# Patient Record
Sex: Female | Born: 1945
Health system: Southern US, Community
[De-identification: ages and names within clinical notes are randomized; demographics above are authoritative.]

## PROBLEM LIST (undated history)

## (undated) DIAGNOSIS — E785 Hyperlipidemia, unspecified: Secondary | ICD-10-CM

## (undated) DIAGNOSIS — I10 Essential (primary) hypertension: Secondary | ICD-10-CM

## (undated) DIAGNOSIS — R32 Unspecified urinary incontinence: Secondary | ICD-10-CM

## (undated) DIAGNOSIS — R112 Nausea with vomiting, unspecified: Secondary | ICD-10-CM

## (undated) DIAGNOSIS — Z9889 Other specified postprocedural states: Secondary | ICD-10-CM

## (undated) DIAGNOSIS — K759 Inflammatory liver disease, unspecified: Secondary | ICD-10-CM

## (undated) DIAGNOSIS — R51 Headache: Secondary | ICD-10-CM

## (undated) DIAGNOSIS — T4145XA Adverse effect of unspecified anesthetic, initial encounter: Secondary | ICD-10-CM

## (undated) DIAGNOSIS — R232 Flushing: Secondary | ICD-10-CM

## (undated) DIAGNOSIS — T8859XA Other complications of anesthesia, initial encounter: Secondary | ICD-10-CM

## (undated) DIAGNOSIS — K219 Gastro-esophageal reflux disease without esophagitis: Secondary | ICD-10-CM

## (undated) HISTORY — PX: TUBAL LIGATION: SHX77

---

## 1989-06-19 HISTORY — PX: ABDOMINAL HYSTERECTOMY: SHX81

## 1999-11-29 ENCOUNTER — Encounter: Admission: RE | Admit: 1999-11-29 | Discharge: 1999-11-29 | Payer: Self-pay | Admitting: Family Medicine

## 1999-11-29 ENCOUNTER — Encounter: Payer: Self-pay | Admitting: Family Medicine

## 2003-12-22 ENCOUNTER — Other Ambulatory Visit: Admission: RE | Admit: 2003-12-22 | Discharge: 2003-12-22 | Payer: Self-pay | Admitting: Family Medicine

## 2009-05-20 ENCOUNTER — Encounter: Admission: RE | Admit: 2009-05-20 | Discharge: 2009-05-20 | Payer: Self-pay | Admitting: Diagnostic Neuroimaging

## 2009-05-27 ENCOUNTER — Encounter (INDEPENDENT_AMBULATORY_CARE_PROVIDER_SITE_OTHER): Payer: Self-pay | Admitting: Diagnostic Neuroimaging

## 2009-05-27 ENCOUNTER — Ambulatory Visit: Payer: Self-pay

## 2009-05-27 ENCOUNTER — Ambulatory Visit (HOSPITAL_COMMUNITY): Admission: RE | Admit: 2009-05-27 | Discharge: 2009-05-27 | Payer: Self-pay | Admitting: Diagnostic Neuroimaging

## 2009-05-27 ENCOUNTER — Ambulatory Visit: Payer: Self-pay | Admitting: Cardiology

## 2009-06-01 ENCOUNTER — Encounter: Payer: Self-pay | Admitting: Cardiology

## 2010-06-19 HISTORY — PX: BUNIONECTOMY: SHX129

## 2013-01-08 DIAGNOSIS — Z5181 Encounter for therapeutic drug level monitoring: Secondary | ICD-10-CM | POA: Diagnosis not present

## 2013-01-08 DIAGNOSIS — Z79899 Other long term (current) drug therapy: Secondary | ICD-10-CM | POA: Diagnosis not present

## 2013-01-08 DIAGNOSIS — Z8371 Family history of colonic polyps: Secondary | ICD-10-CM | POA: Diagnosis not present

## 2013-01-08 DIAGNOSIS — K219 Gastro-esophageal reflux disease without esophagitis: Secondary | ICD-10-CM | POA: Diagnosis not present

## 2013-02-04 DIAGNOSIS — H2510 Age-related nuclear cataract, unspecified eye: Secondary | ICD-10-CM | POA: Diagnosis not present

## 2013-03-04 DIAGNOSIS — G43909 Migraine, unspecified, not intractable, without status migrainosus: Secondary | ICD-10-CM | POA: Diagnosis not present

## 2013-03-04 DIAGNOSIS — I10 Essential (primary) hypertension: Secondary | ICD-10-CM | POA: Diagnosis not present

## 2013-03-04 DIAGNOSIS — Z0000 Encounter for general adult medical examination without abnormal findings: Secondary | ICD-10-CM | POA: Diagnosis not present

## 2013-03-04 DIAGNOSIS — N819 Female genital prolapse, unspecified: Secondary | ICD-10-CM | POA: Diagnosis not present

## 2013-03-04 DIAGNOSIS — E7800 Pure hypercholesterolemia, unspecified: Secondary | ICD-10-CM | POA: Diagnosis not present

## 2013-03-04 DIAGNOSIS — N951 Menopausal and female climacteric states: Secondary | ICD-10-CM | POA: Diagnosis not present

## 2013-03-18 DIAGNOSIS — Z23 Encounter for immunization: Secondary | ICD-10-CM | POA: Diagnosis not present

## 2013-03-31 DIAGNOSIS — N819 Female genital prolapse, unspecified: Secondary | ICD-10-CM | POA: Diagnosis not present

## 2013-06-16 DIAGNOSIS — R350 Frequency of micturition: Secondary | ICD-10-CM | POA: Diagnosis not present

## 2013-06-16 DIAGNOSIS — N3946 Mixed incontinence: Secondary | ICD-10-CM | POA: Diagnosis not present

## 2013-06-16 DIAGNOSIS — N816 Rectocele: Secondary | ICD-10-CM | POA: Diagnosis not present

## 2013-06-16 DIAGNOSIS — N8111 Cystocele, midline: Secondary | ICD-10-CM | POA: Diagnosis not present

## 2013-07-04 DIAGNOSIS — E538 Deficiency of other specified B group vitamins: Secondary | ICD-10-CM | POA: Diagnosis not present

## 2013-07-04 DIAGNOSIS — Z23 Encounter for immunization: Secondary | ICD-10-CM | POA: Diagnosis not present

## 2013-07-04 DIAGNOSIS — F329 Major depressive disorder, single episode, unspecified: Secondary | ICD-10-CM | POA: Diagnosis not present

## 2013-07-04 DIAGNOSIS — E7800 Pure hypercholesterolemia, unspecified: Secondary | ICD-10-CM | POA: Diagnosis not present

## 2013-07-04 DIAGNOSIS — F3289 Other specified depressive episodes: Secondary | ICD-10-CM | POA: Diagnosis not present

## 2013-07-04 DIAGNOSIS — I10 Essential (primary) hypertension: Secondary | ICD-10-CM | POA: Diagnosis not present

## 2013-07-04 DIAGNOSIS — F411 Generalized anxiety disorder: Secondary | ICD-10-CM | POA: Diagnosis not present

## 2013-07-04 DIAGNOSIS — Z0000 Encounter for general adult medical examination without abnormal findings: Secondary | ICD-10-CM | POA: Diagnosis not present

## 2013-07-04 DIAGNOSIS — N951 Menopausal and female climacteric states: Secondary | ICD-10-CM | POA: Diagnosis not present

## 2013-07-15 DIAGNOSIS — R351 Nocturia: Secondary | ICD-10-CM | POA: Diagnosis not present

## 2013-07-15 DIAGNOSIS — R350 Frequency of micturition: Secondary | ICD-10-CM | POA: Diagnosis not present

## 2013-07-15 DIAGNOSIS — N3946 Mixed incontinence: Secondary | ICD-10-CM | POA: Diagnosis not present

## 2013-07-21 DIAGNOSIS — N816 Rectocele: Secondary | ICD-10-CM | POA: Diagnosis not present

## 2013-07-21 DIAGNOSIS — N3946 Mixed incontinence: Secondary | ICD-10-CM | POA: Diagnosis not present

## 2013-07-30 ENCOUNTER — Other Ambulatory Visit: Payer: Self-pay | Admitting: Urology

## 2013-09-01 ENCOUNTER — Other Ambulatory Visit: Payer: Self-pay | Admitting: Urology

## 2013-09-03 ENCOUNTER — Encounter (HOSPITAL_COMMUNITY): Payer: Self-pay | Admitting: Pharmacy Technician

## 2013-09-08 ENCOUNTER — Encounter (HOSPITAL_COMMUNITY): Payer: Self-pay

## 2013-09-08 ENCOUNTER — Encounter (HOSPITAL_COMMUNITY)
Admission: RE | Admit: 2013-09-08 | Discharge: 2013-09-08 | Disposition: A | Discharge status: Home / Self Care | Payer: Medicare Other | Source: Ambulatory Visit | Attending: Urology | Admitting: Urology

## 2013-09-08 ENCOUNTER — Ambulatory Visit (HOSPITAL_COMMUNITY)
Admission: RE | Admit: 2013-09-08 | Discharge: 2013-09-08 | Disposition: A | Discharge status: Home / Self Care | Payer: Medicare Other | Source: Ambulatory Visit | Attending: Urology | Admitting: Urology

## 2013-09-08 DIAGNOSIS — R0602 Shortness of breath: Secondary | ICD-10-CM | POA: Diagnosis not present

## 2013-09-08 DIAGNOSIS — Z01812 Encounter for preprocedural laboratory examination: Secondary | ICD-10-CM | POA: Insufficient documentation

## 2013-09-08 HISTORY — DX: Nausea with vomiting, unspecified: R11.2

## 2013-09-08 HISTORY — DX: Other specified postprocedural states: Z98.890

## 2013-09-08 HISTORY — DX: Essential (primary) hypertension: I10

## 2013-09-08 HISTORY — DX: Headache: R51

## 2013-09-08 HISTORY — DX: Flushing: R23.2

## 2013-09-08 HISTORY — DX: Unspecified urinary incontinence: R32

## 2013-09-08 HISTORY — DX: Other complications of anesthesia, initial encounter: T88.59XA

## 2013-09-08 HISTORY — DX: Gastro-esophageal reflux disease without esophagitis: K21.9

## 2013-09-08 HISTORY — DX: Inflammatory liver disease, unspecified: K75.9

## 2013-09-08 HISTORY — DX: Adverse effect of unspecified anesthetic, initial encounter: T41.45XA

## 2013-09-08 HISTORY — DX: Hyperlipidemia, unspecified: E78.5

## 2013-09-08 LAB — BASIC METABOLIC PANEL
BUN: 14 mg/dL (ref 6–23)
CHLORIDE: 98 meq/L (ref 96–112)
CO2: 23 mEq/L (ref 19–32)
Calcium: 9.7 mg/dL (ref 8.4–10.5)
Creatinine, Ser: 0.83 mg/dL (ref 0.50–1.10)
GFR calc Af Amer: 84 mL/min — ABNORMAL LOW (ref >90)
GFR calc non Af Amer: 72 mL/min — ABNORMAL LOW (ref >90)
Glucose, Bld: 137 mg/dL — ABNORMAL HIGH (ref 70–99)
POTASSIUM: 3.9 meq/L (ref 3.7–5.3)
Sodium: 136 mEq/L — ABNORMAL LOW (ref 137–147)

## 2013-09-08 LAB — APTT: aPTT: 26 seconds (ref 24–37)

## 2013-09-08 LAB — CBC
HCT: 43.1 % (ref 36.0–46.0)
HEMOGLOBIN: 14.6 g/dL (ref 12.0–15.0)
MCH: 29.8 pg (ref 26.0–34.0)
MCHC: 33.9 g/dL (ref 30.0–36.0)
MCV: 88 fL (ref 78.0–100.0)
Platelets: 236 10*3/uL (ref 150–400)
RBC: 4.9 MIL/uL (ref 3.87–5.11)
RDW: 12.9 % (ref 11.5–15.5)
WBC: 6.8 10*3/uL (ref 4.0–10.5)

## 2013-09-08 LAB — ABO/RH: ABO/RH(D): O NEG

## 2013-09-08 LAB — PROTIME-INR
INR: 0.94 (ref 0.00–1.49)
PROTHROMBIN TIME: 12.4 s (ref 11.6–15.2)

## 2013-09-08 NOTE — Progress Notes (Signed)
Abnormal CXR faxed to Dr. Matilde Sprang

## 2013-09-08 NOTE — Patient Instructions (Addendum)
Sophia Bailey  09/08/2013                           YOUR PROCEDURE IS SCHEDULED ON: 09/16/13               PLEASE REPORT TO SHORT STAY CENTER AT :  5:30 AM               CALL THIS NUMBER IF ANY PROBLEMS THE DAY OF SURGERY :               [phone]                                REMEMBER:   Do not eat food or drink liquids AFTER MIDNIGHT                  Take these medicines the morning of surgery with A SIP OF WATER:  PRILOSEC   Do not wear jewelry, make-up   Do not wear lotions, powders, or perfumes.   Do not shave legs or underarms 12 hrs. before surgery (men may shave face)  Do not bring valuables to the hospital.  Contacts, dentures or bridgework may not be worn into surgery.  Leave suitcase in the car. After surgery it may be brought to your room.  For patients admitted to the hospital more than one night, checkout time is            11:00 AM                                                       The day of discharge.   Patients discharged the day of surgery will not be allowed to drive home.            If going home same day of surgery, must have someone stay with you              FIRST 24 hrs at home and arrange for some one to drive you              home from hospital.    Special Instructions             Please read over the following fact sheets that you were given:               1. [place]                2. USE [employer]LE[employer] [place]                                                X_____________________________________________________________________        Failure to follow these instructions may result in cancellation of your surgery

## 2013-09-09 ENCOUNTER — Other Ambulatory Visit (HOSPITAL_COMMUNITY): Payer: Self-pay | Admitting: Urology

## 2013-09-09 DIAGNOSIS — R222 Localized swelling, mass and lump, trunk: Secondary | ICD-10-CM

## 2013-09-10 ENCOUNTER — Ambulatory Visit (HOSPITAL_COMMUNITY)
Admission: RE | Admit: 2013-09-10 | Discharge: 2013-09-10 | Disposition: A | Discharge status: Home / Self Care | Payer: Medicare Other | Source: Ambulatory Visit | Attending: Urology | Admitting: Urology

## 2013-09-10 DIAGNOSIS — R222 Localized swelling, mass and lump, trunk: Secondary | ICD-10-CM

## 2013-09-10 DIAGNOSIS — R918 Other nonspecific abnormal finding of lung field: Secondary | ICD-10-CM | POA: Diagnosis not present

## 2013-09-10 DIAGNOSIS — I77810 Thoracic aortic ectasia: Secondary | ICD-10-CM | POA: Diagnosis not present

## 2013-09-10 MED ORDER — IOHEXOL 300 MG/ML  SOLN
80.0000 mL | Freq: Once | INTRAMUSCULAR | Status: AC | PRN
  Administered 2013-09-10: 80 mL via INTRAVENOUS

## 2013-09-15 MED ORDER — GENTAMICIN SULFATE 40 MG/ML IJ SOLN
340.0000 mg | INTRAVENOUS | Status: AC
  Administered 2013-09-16: 340 mg via INTRAVENOUS
  Filled 2013-09-15: qty 8.5

## 2013-09-15 NOTE — H&P (Signed)
History of Present Illness   Ms. Sophia Bailey has mild stress and urge incontinence. She voided every 60-90 minutes. She can feel vaginal bulging. She has had a hysterectomy. On pelvic examination she had a negative cough test with grade 2 hypermobility. She had a small grade 2 cystocele with very little central defect. She had a modest distal grade 2 rectocele. She has moderately severe frequency and mild to moderate nocturia. Yesterday I thought her prolapse symptoms were probably from a rectocele and I wanted to reevaluate the ____cystocele fluoroscopically. If she ever consented to surgery, she likely would benefit from a rectocele repair and possible sling.   Review of Systems: No change in bowel or neurologic systems.   On urodynamics, she emptied efficiently. Her maximum capacity was 243 mL. Her bladder was unstable reaching pressures of 13 cmH2O but she did not leak. At 100 mL she had very mild leakage at 69 cmH2O with a cough. It was 91 cmH2O with a Valsalva. At 169 mL it was 76 cmH2O with mild leakage. During voluntary voiding, she voided 243 mL with a maximum flow of 28 mL/sec. Maximum voiding pressure was 22 cmH2O. Bladder neck descended approximately 2 cm. She had a small cystocele as noted. The details of the urodynamics are signed and dictated on the urodynamic sheet.    Past Medical History Problems  1. History of hypercholesterolemia (V12.29) 2. History of hypertension (V12.59) 3. History of Reflux (530.81)  Surgical History Problems  1. History of Hysterectomy 2. History of Simple Bunion Exostectomy (Silver Procedure)  Current Meds 1. Lisinopril-Hydrochlorothiazide 10-12.5 MG Oral Tablet;  Therapy: (Recorded:29Dec2014) to Recorded 2. Maxalt TABS;  Therapy: (Recorded:29Dec2014) to Recorded 3. [place];  Therapy: (Recorded:29Dec2014) to Recorded  Allergies Medication  1. Demerol TABS 2. Codeine Derivatives  Family History Problems  1. Family history of malignant  neoplasm of breast (V16.3) : Mother 2. Family history of myocardial infarction (V17.3) : Father  Social History Problems  1. Alcohol use   1 or 2 per month 2. Daily caffeine consumption, 2-3 servings a day 3. Death in the family, father   age 91 due to heart attack 4. Married 5. Non-smoker (V49.89) 6. Retired   Pharmacist, hospital 7. Three children   one son and two daughters  Assessment Assessed  1. Rectocele, female (618.04) 2. Urge and stress incontinence (788.33)  Discussion/Summary   Ms. Sophia Bailey has mild stress and urge incontinence. I think both issues are reasonably significant. She has some descensus at rest with a small grade 2 cystocele but not a lot of further descensus and very little central defect. In my opinion, she is likely feeling her modest distal grade 2 rectocele. Her frequency is due to an overactive bladder.   I drew her a picture. I discussed a rectocele repair and possible cystocele repair versus watchful waiting.   I drew her a picture and we talked about prolapse surgery in detail. Pros, cons, general surgical and anesthetic risks, and other options including behavioral therapy, pessaries, and watchful waiting were discussed. She understands that prolapse repairs are successful in 80-85% of cases for prolapse symptoms and can recur anteriorly, posteriorly, and/or apically. She understands that in most cases I use a graft and general risks were discussed. Surgical risks were described but not limited to the discussion of injury to neighboring structures including the bowel (with possible life-threatening sepsis and colostomy), bladder, urethra, vagina (all resulting in further surgery), and ureter (resulting in re-implantation). We talked about injury to nerves/soft tissue leading to  debilitating and intractable pelvic, abdominal, and lower extremity pain syndromes and neuropathies. The risks of buttock pain, intractable dyspareunia, and vaginal narrowing and shortening  with sequelae were discussed. Bleeding risks, transfusion rates, and infection were discussed. The risk of persistent, de novo, or worsening bladder and/or bowel incontinence/dysfunction was discussed. The need for CIC was described as well the usual post-operative course. The patient understands that she might not reach her treatment goal and that she might be worse following surgery.  I talked to her about medical, behavioral, and physical therapy versus sling.  We talked about a sling in detail. Pros, cons, general surgical and anesthetic risks, and other options including behavioral therapy and watchful waiting were discussed. She understands that slings are generally successful in 90% of cases for stress incontinence, 50% for urge incontinence, and that in a small percentage of cases the incontinence can worsen. The risk of persistent, de novo, or worsening incontinence/dysfunction was discussed. Risks were described but not limited to the discussion of injury to neighboring structures including the bowel (with possible life-threatening sepsis and colostomy), bladder, urethra, vagina (all resulting in further surgery), and ureter (resulting in re-implantation). We also talked about the risk of retention requiring urethrolysis, extrusion requiring revision, and erosion resulting in further surgery. Bleeding risks and transfusion rates and the risk of infection were discussed. The risk of pelvic and abdominal pain syndromes, dyspareunia, and neuropathies were discussed. The need for CIC was described as well as the usual postoperative course. The patient understands that she might not reach her treatment goal and that she might be worse following surgery. Mesh TV issues were discussed.   Ms. Sophia Bailey is sexually active. She sometimes has dyspareunia. She uses over-the-counter products for vaginal dryness.   She says as long as she listens to her bladder, she is happy with her bladder function and she said  she would not have a sling. At this stage she is not going to start medical or physical therapy.  She really thinks she is probably going to go ahead and get a rectocele repair and intraoperatively she would need a cystocele repair or perform one. She was happy with our discussion. Mesh issues on TV discussed. See p.r.n. She would ____  After a thorough review of the management options for the patient's condition the patient  elected to proceed with surgical therapy as noted above. We have discussed the potential benefits and risks of the procedure, side effects of the proposed treatment, the likelihood of the patient achieving the goals of the procedure, and any potential problems that might occur during the procedure or recuperation. Informed consent has been obtained.

## 2013-09-16 ENCOUNTER — Ambulatory Visit (HOSPITAL_COMMUNITY): Payer: Medicare Other | Admitting: Anesthesiology

## 2013-09-16 ENCOUNTER — Observation Stay (HOSPITAL_COMMUNITY)
Admission: RE | Admit: 2013-09-16 | Discharge: 2013-09-17 | Disposition: A | Discharge status: Home / Self Care | Payer: Medicare Other | Source: Ambulatory Visit | Attending: Urology | Admitting: Urology

## 2013-09-16 ENCOUNTER — Encounter (HOSPITAL_COMMUNITY): Payer: Self-pay | Admitting: *Deleted

## 2013-09-16 ENCOUNTER — Encounter (HOSPITAL_COMMUNITY)
Admission: RE | Disposition: A | Discharge status: Home / Self Care | Payer: Self-pay | Source: Ambulatory Visit | Attending: Urology

## 2013-09-16 ENCOUNTER — Encounter (HOSPITAL_COMMUNITY): Payer: Medicare Other | Admitting: Anesthesiology

## 2013-09-16 DIAGNOSIS — IMO0002 Reserved for concepts with insufficient information to code with codable children: Secondary | ICD-10-CM | POA: Diagnosis not present

## 2013-09-16 DIAGNOSIS — N3946 Mixed incontinence: Secondary | ICD-10-CM | POA: Diagnosis not present

## 2013-09-16 DIAGNOSIS — I10 Essential (primary) hypertension: Secondary | ICD-10-CM | POA: Diagnosis not present

## 2013-09-16 DIAGNOSIS — Z90710 Acquired absence of both cervix and uterus: Secondary | ICD-10-CM | POA: Diagnosis not present

## 2013-09-16 DIAGNOSIS — K219 Gastro-esophageal reflux disease without esophagitis: Secondary | ICD-10-CM | POA: Insufficient documentation

## 2013-09-16 DIAGNOSIS — N8111 Cystocele, midline: Secondary | ICD-10-CM | POA: Diagnosis not present

## 2013-09-16 DIAGNOSIS — N816 Rectocele: Principal | ICD-10-CM | POA: Insufficient documentation

## 2013-09-16 DIAGNOSIS — E7800 Pure hypercholesterolemia, unspecified: Secondary | ICD-10-CM | POA: Insufficient documentation

## 2013-09-16 DIAGNOSIS — Z79899 Other long term (current) drug therapy: Secondary | ICD-10-CM | POA: Insufficient documentation

## 2013-09-16 HISTORY — PX: RECTOCELE REPAIR: SHX761

## 2013-09-16 LAB — TYPE AND SCREEN
ABO/RH(D): O NEG
Antibody Screen: NEGATIVE

## 2013-09-16 LAB — HEMOGLOBIN AND HEMATOCRIT, BLOOD
HCT: 37.9 % (ref 36.0–46.0)
Hemoglobin: 12.9 g/dL (ref 12.0–15.0)

## 2013-09-16 SURGERY — COLPORRHAPHY, POSTERIOR, FOR RECTOCELE REPAIR
Anesthesia: General

## 2013-09-16 MED ORDER — ONDANSETRON HCL 4 MG/2ML IJ SOLN
INTRAMUSCULAR | Status: DC | PRN
  Administered 2013-09-16: 4 mg via INTRAVENOUS

## 2013-09-16 MED ORDER — MORPHINE SULFATE 2 MG/ML IJ SOLN: 2.0000 mg | INTRAMUSCULAR | Status: DC | PRN

## 2013-09-16 MED ORDER — HYDROMORPHONE HCL PF 1 MG/ML IJ SOLN: 0.2500 mg | INTRAMUSCULAR | Status: DC | PRN

## 2013-09-16 MED ORDER — LISINOPRIL-HYDROCHLOROTHIAZIDE 20-25 MG PO TABS: 1.0000 | ORAL_TABLET | Freq: Every morning | ORAL | Status: DC

## 2013-09-16 MED ORDER — RIZATRIPTAN BENZOATE 10 MG PO TABS
10.0000 mg | ORAL_TABLET | ORAL | Status: DC | PRN
  Administered 2013-09-16 (×2): 10 mg via ORAL
  Filled 2013-09-16: qty 1

## 2013-09-16 MED ORDER — PROPOFOL 10 MG/ML IV BOLUS
INTRAVENOUS | Status: AC
  Filled 2013-09-16: qty 20

## 2013-09-16 MED ORDER — SODIUM CHLORIDE 0.9 % IV SOLN
INTRAVENOUS | Status: AC
  Filled 2013-09-16: qty 1.5

## 2013-09-16 MED ORDER — PROPOFOL 10 MG/ML IV BOLUS
INTRAVENOUS | Status: DC | PRN
  Administered 2013-09-16: 150 mg via INTRAVENOUS

## 2013-09-16 MED ORDER — POLYMYXIN B SULFATE 500000 UNITS IJ SOLR
INTRAMUSCULAR | Status: DC | PRN
  Administered 2013-09-16: 10:00:00

## 2013-09-16 MED ORDER — SODIUM CHLORIDE 0.9 % IV SOLN
1.5000 g | INTRAVENOUS | Status: AC
  Administered 2013-09-16: 1.5 g via INTRAVENOUS
  Filled 2013-09-16: qty 1.5

## 2013-09-16 MED ORDER — SCOPOLAMINE 1 MG/3DAYS TD PT72
MEDICATED_PATCH | TRANSDERMAL | Status: AC
  Filled 2013-09-16: qty 1

## 2013-09-16 MED ORDER — ESTRADIOL 0.1 MG/GM VA CREA
TOPICAL_CREAM | VAGINAL | Status: AC
  Filled 2013-09-16: qty 85

## 2013-09-16 MED ORDER — ONDANSETRON HCL 4 MG/2ML IJ SOLN
INTRAMUSCULAR | Status: AC
  Filled 2013-09-16: qty 2

## 2013-09-16 MED ORDER — OXYCODONE-ACETAMINOPHEN 5-325 MG PO TABS: 1.0000 | ORAL_TABLET | ORAL | Status: DC | PRN

## 2013-09-16 MED ORDER — FENTANYL CITRATE 0.05 MG/ML IJ SOLN
INTRAMUSCULAR | Status: DC | PRN
  Administered 2013-09-16 (×5): 50 ug via INTRAVENOUS

## 2013-09-16 MED ORDER — PROMETHAZINE HCL 25 MG/ML IJ SOLN
INTRAMUSCULAR | Status: AC
  Filled 2013-09-16: qty 1

## 2013-09-16 MED ORDER — NEOSTIGMINE METHYLSULFATE 1 MG/ML IJ SOLN
INTRAMUSCULAR | Status: DC | PRN
  Administered 2013-09-16: 4 mg via INTRAVENOUS

## 2013-09-16 MED ORDER — ROCURONIUM BROMIDE 100 MG/10ML IV SOLN
INTRAVENOUS | Status: DC | PRN
  Administered 2013-09-16: 30 mg via INTRAVENOUS

## 2013-09-16 MED ORDER — DEXAMETHASONE SODIUM PHOSPHATE 10 MG/ML IJ SOLN
INTRAMUSCULAR | Status: DC | PRN
  Administered 2013-09-16: 10 mg via INTRAVENOUS

## 2013-09-16 MED ORDER — SCOPOLAMINE 1 MG/3DAYS TD PT72
MEDICATED_PATCH | TRANSDERMAL | Status: DC | PRN
  Administered 2013-09-16: 1 via TRANSDERMAL

## 2013-09-16 MED ORDER — ROCURONIUM BROMIDE 100 MG/10ML IV SOLN
INTRAVENOUS | Status: AC
  Filled 2013-09-16: qty 1

## 2013-09-16 MED ORDER — GLYCOPYRROLATE 0.2 MG/ML IJ SOLN
INTRAMUSCULAR | Status: DC | PRN
  Administered 2013-09-16: 0.6 mg via INTRAVENOUS

## 2013-09-16 MED ORDER — ONDANSETRON HCL 4 MG/2ML IJ SOLN
4.0000 mg | Freq: Four times a day (QID) | INTRAMUSCULAR | Status: DC | PRN
  Administered 2013-09-16: 4 mg via INTRAVENOUS
  Filled 2013-09-16: qty 2

## 2013-09-16 MED ORDER — FLEET ENEMA 7-19 GM/118ML RE ENEM
1.0000 | ENEMA | Freq: Once | RECTAL | Status: AC
  Administered 2013-09-16: 07:00:00 via RECTAL
  Filled 2013-09-16: qty 1

## 2013-09-16 MED ORDER — OMEPRAZOLE MAGNESIUM 20 MG PO TBEC: 20.0000 mg | DELAYED_RELEASE_TABLET | Freq: Every day | ORAL | Status: DC

## 2013-09-16 MED ORDER — MIDAZOLAM HCL 2 MG/2ML IJ SOLN
INTRAMUSCULAR | Status: AC
  Filled 2013-09-16: qty 2

## 2013-09-16 MED ORDER — FENTANYL CITRATE 0.05 MG/ML IJ SOLN
INTRAMUSCULAR | Status: AC
  Filled 2013-09-16: qty 5

## 2013-09-16 MED ORDER — LACTATED RINGERS IV SOLN: INTRAVENOUS | Status: DC

## 2013-09-16 MED ORDER — SIMVASTATIN 5 MG PO TABS
5.0000 mg | ORAL_TABLET | Freq: Every day | ORAL | Status: DC
  Administered 2013-09-16: 5 mg via ORAL
  Filled 2013-09-16 (×2): qty 1

## 2013-09-16 MED ORDER — PHENAZOPYRIDINE HCL 200 MG PO TABS
200.0000 mg | ORAL_TABLET | Freq: Once | ORAL | Status: AC
  Administered 2013-09-16: 200 mg via ORAL
  Filled 2013-09-16: qty 1

## 2013-09-16 MED ORDER — LACTATED RINGERS IV SOLN
INTRAVENOUS | Status: DC | PRN
  Administered 2013-09-16 (×2): via INTRAVENOUS

## 2013-09-16 MED ORDER — HYDROCHLOROTHIAZIDE 25 MG PO TABS
25.0000 mg | ORAL_TABLET | Freq: Every day | ORAL | Status: DC
  Administered 2013-09-16: 25 mg via ORAL
  Filled 2013-09-16 (×2): qty 1

## 2013-09-16 MED ORDER — ESTRADIOL 0.1 MG/GM VA CREA
TOPICAL_CREAM | VAGINAL | Status: DC | PRN
  Administered 2013-09-16: 1 via VAGINAL

## 2013-09-16 MED ORDER — LIDOCAINE-EPINEPHRINE (PF) 1 %-1:200000 IJ SOLN
INTRAMUSCULAR | Status: DC | PRN
  Administered 2013-09-16: 15 mL

## 2013-09-16 MED ORDER — PROMETHAZINE HCL 25 MG/ML IJ SOLN
6.2500 mg | INTRAMUSCULAR | Status: AC | PRN
  Administered 2013-09-16 (×2): 12.5 mg via INTRAVENOUS

## 2013-09-16 MED ORDER — DEXAMETHASONE SODIUM PHOSPHATE 10 MG/ML IJ SOLN
INTRAMUSCULAR | Status: AC
  Filled 2013-09-16: qty 1

## 2013-09-16 MED ORDER — NEOSTIGMINE METHYLSULFATE 1 MG/ML IJ SOLN
INTRAMUSCULAR | Status: AC
  Filled 2013-09-16: qty 10

## 2013-09-16 MED ORDER — ACETAMINOPHEN 325 MG PO TABS
650.0000 mg | ORAL_TABLET | ORAL | Status: DC | PRN
  Administered 2013-09-16: 650 mg via ORAL
  Filled 2013-09-16: qty 2

## 2013-09-16 MED ORDER — DEXTROSE-NACL 5-0.45 % IV SOLN
INTRAVENOUS | Status: DC
  Administered 2013-09-16: 11:00:00 via INTRAVENOUS

## 2013-09-16 MED ORDER — METOCLOPRAMIDE HCL 5 MG/ML IJ SOLN
INTRAMUSCULAR | Status: DC | PRN
  Administered 2013-09-16: 10 mg via INTRAVENOUS

## 2013-09-16 MED ORDER — LISINOPRIL 20 MG PO TABS
20.0000 mg | ORAL_TABLET | Freq: Every day | ORAL | Status: DC
  Administered 2013-09-16: 20 mg via ORAL
  Filled 2013-09-16 (×2): qty 1

## 2013-09-16 MED ORDER — OXYCODONE-ACETAMINOPHEN 5-325 MG PO TABS: 1.0000 | ORAL_TABLET | Freq: Four times a day (QID) | ORAL | Status: DC | PRN

## 2013-09-16 MED ORDER — SUCCINYLCHOLINE CHLORIDE 20 MG/ML IJ SOLN
INTRAMUSCULAR | Status: DC | PRN
  Administered 2013-09-16: 100 mg via INTRAVENOUS

## 2013-09-16 MED ORDER — METOCLOPRAMIDE HCL 5 MG/ML IJ SOLN
INTRAMUSCULAR | Status: AC
  Filled 2013-09-16: qty 2

## 2013-09-16 MED ORDER — MIDAZOLAM HCL 5 MG/5ML IJ SOLN
INTRAMUSCULAR | Status: DC | PRN
  Administered 2013-09-16 (×2): 1 mg via INTRAVENOUS

## 2013-09-16 MED ORDER — GLYCOPYRROLATE 0.2 MG/ML IJ SOLN
INTRAMUSCULAR | Status: AC
  Filled 2013-09-16: qty 3

## 2013-09-16 MED ORDER — PANTOPRAZOLE SODIUM 40 MG PO TBEC
40.0000 mg | DELAYED_RELEASE_TABLET | Freq: Every day | ORAL | Status: DC
  Filled 2013-09-16: qty 1

## 2013-09-16 SURGICAL SUPPLY — 50 items
BAG URINE DRAINAGE (UROLOGICAL SUPPLIES) ×3 IMPLANT
BLADE HEX COATED 2.75 (ELECTRODE) ×3 IMPLANT
BLADE SURG 15 STRL LF DISP TIS (BLADE) ×2 IMPLANT
BLADE SURG 15 STRL SS (BLADE) ×4
CATH FOLEY 2WAY SLVR  5CC 16FR (CATHETERS) ×2
CATH FOLEY 2WAY SLVR 5CC 16FR (CATHETERS) ×1 IMPLANT
COVER MAYO STAND STRL (DRAPES) IMPLANT
COVER SURGICAL LIGHT HANDLE (MISCELLANEOUS) ×3 IMPLANT
DEVICE CAPIO SLIM SINGLE (INSTRUMENTS) IMPLANT
DEVICE CAPIO SUTURING (INSTRUMENTS) ×2
DEVICE CAPIO SUTURING OPC (INSTRUMENTS) ×1 IMPLANT
DRAIN PENROSE 18X1/4 LTX STRL (WOUND CARE) ×3 IMPLANT
GAUZE PACKING 2X5 YD STRL (GAUZE/BANDAGES/DRESSINGS) ×3 IMPLANT
GAUZE SPONGE 4X4 16PLY XRAY LF (GAUZE/BANDAGES/DRESSINGS) ×12 IMPLANT
GLOVE BIOGEL M STRL SZ7.5 (GLOVE) ×3 IMPLANT
GLOVE ECLIPSE 8.5 STRL (GLOVE) ×12 IMPLANT
GOWN STRL REUS W/TWL XL LVL3 (GOWN DISPOSABLE) ×3 IMPLANT
HOLDER FOLEY CATH W/STRAP (MISCELLANEOUS) ×3 IMPLANT
IV NS 1000ML (IV SOLUTION) ×2
IV NS 1000ML BAXH (IV SOLUTION) ×1 IMPLANT
KIT BASIN OR (CUSTOM PROCEDURE TRAY) ×3 IMPLANT
MANIFOLD NEPTUNE II (INSTRUMENTS) ×3 IMPLANT
NEEDLE HYPO 22GX1.5 SAFETY (NEEDLE) ×3 IMPLANT
NEEDLE MAYO 6 CRC TAPER PT (NEEDLE) ×3 IMPLANT
NS IRRIG 1000ML POUR BTL (IV SOLUTION) IMPLANT
PACK CYSTO (CUSTOM PROCEDURE TRAY) ×3 IMPLANT
PENCIL BUTTON HOLSTER BLD 10FT (ELECTRODE) ×3 IMPLANT
PLUG CATH AND CAP STER (CATHETERS) ×3 IMPLANT
RETRACTOR STAY HOOK 5MM (MISCELLANEOUS) ×3 IMPLANT
SHEET LAVH (DRAPES) ×3 IMPLANT
SUT CAPIO ETHIBPND (SUTURE) IMPLANT
SUT VIC AB 0 CT1 27 (SUTURE) ×2
SUT VIC AB 0 CT1 27XBRD ANTBC (SUTURE) ×1 IMPLANT
SUT VIC AB 2-0 CT1 27 (SUTURE) ×6
SUT VIC AB 2-0 CT1 27XBRD (SUTURE) ×3 IMPLANT
SUT VIC AB 2-0 SH 27 (SUTURE) ×16
SUT VIC AB 2-0 SH 27X BRD (SUTURE) ×8 IMPLANT
SUT VIC AB 3-0 PS2 18 (SUTURE)
SUT VIC AB 3-0 PS2 18XBRD (SUTURE) IMPLANT
SUT VIC AB 3-0 SH 27 (SUTURE) ×2
SUT VIC AB 3-0 SH 27XBRD (SUTURE) ×1 IMPLANT
SUT VICRYL 0 UR6 27IN ABS (SUTURE) IMPLANT
SYRINGE 10CC LL (SYRINGE) ×3 IMPLANT
TOWEL OR 17X26 10 PK STRL BLUE (TOWEL DISPOSABLE) ×3 IMPLANT
TOWEL OR NON WOVEN STRL DISP B (DISPOSABLE) ×3 IMPLANT
TUBING CONNECTING 10 (TUBING) ×4 IMPLANT
TUBING CONNECTING 10' (TUBING) ×2
WATER STERILE IRR 1500ML POUR (IV SOLUTION) IMPLANT
WATER STERILE IRR 500ML POUR (IV SOLUTION) ×3 IMPLANT
YANKAUER SUCT BULB TIP 10FT TU (MISCELLANEOUS) ×3 IMPLANT

## 2013-09-16 NOTE — Preoperative (Signed)
Beta Blockers   Reason not to administer Beta Blockers:Not Applicable 

## 2013-09-16 NOTE — Transfer of Care (Signed)
Immediate Anesthesia Transfer of Care Note  Patient: Sophia Bailey  Procedure(s) Performed: Procedure(s): POSTERIOR REPAIR (RECTOCELE)WITH CYSTOSCOPY (N/A)  Patient Location: PACU  Anesthesia Type:General  Level of Consciousness: awake, alert , oriented and patient cooperative  Airway & Oxygen Therapy: Patient Spontanous Breathing and Patient connected to face mask oxygen  Post-op Assessment: Report given to PACU RN and Post -op Vital signs reviewed and stable  Post vital signs: Reviewed and stable  Complications: No apparent anesthesia complications

## 2013-09-16 NOTE — Op Note (Signed)
Preoperative diagnosis: Rectocele and small cystocele Postoperative diagnosis: Rectocele and small cystocele Surgery: Rectocele repair and cystoscopy Surgeon: Dr. Nicki Reaper Sophia Bailey Asst.: Leta Baptist  The patient has the above diagnoses and consented above procedure. Extra care was taken with leg positioning to minimize the risk of compartment syndrome and neuropathy and deep vein thrombosis. Under anesthesia she virtually had little to no cystocele and a very well supported vaginal cuff. She had deficiency of the posterior fourchette making her small to moderate distal rectocele more protuberant. Again her apex well supported. She did have a narrow vagina with a little bit of a cone effect at the apex  I used a lidocaine epinephrine mixture as usual. I placed 2 Allis clamps on the hymenal ring and removed a small triangle of perineal skin. I made a long posterior incision between Allis clamps using my usual technique. I was surprised on how much sharp and careful dissection was needed to mobilize the rectovaginal fascia from the thin vaginal wall mucosa all the way to the apex and to the sidewall bilaterally. It took many minutes to do so and I used a little bit of finger dissection. It Was almost like the tissues were inflamed.  I did a digital rectal examination and she had reasonable thick rectovaginal fascia and a diffuse defect throughout the length. There was no enterocele  With appropriate retraction I did a 2 layer posterior repair imbricating the rectocele. I was happy with the work I did at the apex. I repeated the rectal examination and she had an excellent rectocele repair. There was no injury to the rectum  The vaginal wall mucosa was never bulging much so i only minimally trimmed a few millimeters. I closed the posterior vaginal wall with running 2-0 Vicryl on a CT1 needle. It was exteriorized at the introitus and I closed the perineum with subcuticular suture. I did 1 gentle 0  Vicryl perineal suture repair. I closed one small buttonhole in the vaginal wall mucosa with 2-0 Vicryl.  The patient had excellent vaginal length. Again she is a little bit of narrowing but not clinically relevant. There was no further narrowing with the repair. She was oozing near the introitus at the end of the case but overall blood loss was less than 150 mL. Vaginal pack with Estrace cream was applied.  I performed cystoscopy. Bladder mucosa and trigone were normal. There was excellent yellow jets bilaterally. Foley catheter is draining well. Patient taken to recovery room. Hopefully the procedure will reach the patient's treatment goal

## 2013-09-16 NOTE — Anesthesia Preprocedure Evaluation (Addendum)
Anesthesia Evaluation  Patient identified by MRN, date of birth, ID band Patient awake    Reviewed: Allergy & Precautions, H&P , NPO status , Patient's Chart, lab work & pertinent test results  History of Anesthesia Complications (+) PONV  Airway Mallampati: III TM Distance: >3 FB Neck ROM: Full  Mouth opening: Limited Mouth Opening  Dental  (+) Teeth Intact, Caps, Dental Advisory Given, Partial Upper   Pulmonary neg pulmonary ROS,  breath sounds clear to auscultation  Pulmonary exam normal       Cardiovascular hypertension, Pt. on medications negative cardio ROS  Rhythm:Regular Rate:Normal     Neuro/Psych negative neurological ROS  negative psych ROS   GI/Hepatic GERD-  Medicated,(+) Hepatitis -  Endo/Other  negative endocrine ROS  Renal/GU negative Renal ROS   Cystocele, Rectocele negative genitourinary   Musculoskeletal negative musculoskeletal ROS (+)   Abdominal   Peds  Hematology negative hematology ROS (+)   Anesthesia Other Findings   Reproductive/Obstetrics                          Anesthesia Physical Anesthesia Plan  ASA: II  Anesthesia Plan: General   Post-op Pain Management:    Induction: Intravenous  Airway Management Planned: Oral ETT  Additional Equipment:   Intra-op Plan:   Post-operative Plan: Extubation in OR  Informed Consent: I have reviewed the patients History and Physical, chart, labs and discussed the procedure including the risks, benefits and alternatives for the proposed anesthesia with the patient or authorized representative who has indicated his/her understanding and acceptance.   Dental advisory given  Plan Discussed with: CRNA  Anesthesia Plan Comments:         Anesthesia Quick Evaluation

## 2013-09-16 NOTE — Progress Notes (Signed)
Minimal pain No nerve pain Vitals OK Reviewed all surgical findings Reminded pt NO sling as planned

## 2013-09-16 NOTE — Anesthesia Postprocedure Evaluation (Signed)
Anesthesia Post Note  Patient: Sophia Bailey  Procedure(s) Performed: Procedure(s) (LRB): POSTERIOR REPAIR (RECTOCELE)WITH CYSTOSCOPY (N/A)  Anesthesia type: General  Patient location: PACU  Post pain: Pain level controlled  Post assessment: Post-op Vital signs reviewed  Last Vitals:  Filed Vitals:   09/16/13 1158  BP: 136/77  Pulse: 87  Temp: 36.4 C  Resp: 16    Post vital signs: Reviewed  Level of consciousness: sedated  Complications: No apparent anesthesia complications

## 2013-09-17 ENCOUNTER — Encounter (HOSPITAL_COMMUNITY): Payer: Self-pay | Admitting: Urology

## 2013-09-17 LAB — BASIC METABOLIC PANEL
BUN: 8 mg/dL (ref 6–23)
CHLORIDE: 102 meq/L (ref 96–112)
CO2: 23 meq/L (ref 19–32)
CREATININE: 0.76 mg/dL (ref 0.50–1.10)
Calcium: 9.4 mg/dL (ref 8.4–10.5)
GFR calc non Af Amer: 87 mL/min — ABNORMAL LOW (ref >90)
GLUCOSE: 175 mg/dL — AB (ref 70–99)
POTASSIUM: 3.9 meq/L (ref 3.7–5.3)
Sodium: 138 mEq/L (ref 137–147)

## 2013-09-17 LAB — HEMOGLOBIN AND HEMATOCRIT, BLOOD
HCT: 37.6 % (ref 36.0–46.0)
Hemoglobin: 12.7 g/dL (ref 12.0–15.0)

## 2013-09-17 NOTE — Progress Notes (Signed)
Date of admission: 09/16/2013  Date of discharge: 09/17/2013  Admission diagnosis: Rectocele  Discharge diagnosis: Rectocele  Secondary diagnoses: Cystocele  History and Physical: For full details, please see admission history and physical. Briefly, Sophia Bailey is a 68 y.o. year old patient with above diagnosis.   Hospital Course: Surgery and post op course uneventful. Labs and course normal  Laboratory values:  Recent Labs  09/16/13 1619 09/17/13 0340  HGB 12.9 12.7  HCT 37.9 37.6    Recent Labs  09/17/13 0340  CREATININE 0.76    Disposition: Home  Discharge instruction: The patient was instructed to be ambulatory but told to refrain from heavy lifting, strenuous activity, or driving. reviewed  Discharge medications:    Medication List    STOP taking these medications       ibuprofen 200 MG tablet  Commonly known as:  ADVIL,MOTRIN      TAKE these medications       cholecalciferol 1000 UNITS tablet  Commonly known as:  VITAMIN D  Take 1,000 Units by mouth daily.     fexofenadine 180 MG tablet  Commonly known as:  ALLEGRA  Take 180 mg by mouth daily.     lisinopril-hydrochlorothiazide 20-25 MG per tablet  Commonly known as:  PRINZIDE,ZESTORETIC  Take 1 tablet by mouth every morning.     omeprazole 20 MG tablet  Commonly known as:  PRILOSEC OTC  Take 20 mg by mouth daily.     OVER THE COUNTER MEDICATION  Take 2 tablets by mouth every other day. Cholesterice- OVER THE COUNTER CHOLESTEROL MEDICATION     oxyCODONE-acetaminophen 5-325 MG per tablet  Commonly known as:  ROXICET  Take 1-2 tablets by mouth every 6 (six) hours as needed for severe pain.     Potassium Gluconate 595 MG Caps  Take 1 capsule by mouth daily.     pravastatin 10 MG tablet  Commonly known as:  PRAVACHOL  Take 10 mg by mouth every other day.     rizatriptan 10 MG tablet  Commonly known as:  MAXALT  Take 10 mg by mouth as needed for migraine. May repeat in 2 hours if needed         Followup:      Follow-up Information   Follow up with Gino Garrabrant A, MD. (office will call you with appt date and time)    Specialty:  Urology   Contact information:   [facility]  [place] [address] [phone]       Follow up with Markez Dowland A, MD. (as scheduled)    Specialty:  Urology   Contact information:   [facility]  [place] [address] [phone]

## 2013-09-17 NOTE — Progress Notes (Signed)
peripad changed for the first time this shift - small to mod amount of dried brownish/red drainage on pad.  Packing intact.  Was reported to me that MD wants to remove it himself today and not to remove it this AM.  Pt no longer has migrane.  Denies nausea.  Denies any pain in abd/perinuem.

## 2013-09-17 NOTE — Progress Notes (Signed)
C/o of migrane h/a, frontal, 7/10.  States I take 10 mg Maxalt c MR in 2 hours prn for chronic migranes.  Dtr shows me bottle and PTA medication form show same.  Notified [place] on call.  Order received for same.  Using pt home meds - protocol followed.  After 2 hours pain down a 6 and required repeat dose.  Pain decreased to 3 after 2nd dose.  Pt states she does NOT have any surgical pain.  And believes previous nausea was from migrane.   Has been tolerating clear liquids and requests to try crackers.  Does so and tolerates well.  No nausea or vomiting at all this shift will increase diet.

## 2013-09-17 NOTE — Discharge Instructions (Signed)
As discussed with Dr. Matilde Sprang.  You may resume advil 7 days after surgery. I have reviewed discharge instructions in detail with the patient. They will follow-up with me or their physician as scheduled. My nurse will also be calling the patients as per protocol.

## 2013-09-28 NOTE — Discharge Summary (Signed)
Date of admission: 09/16/2013  Date of discharge: 09/17/2013  Admission diagnosis: Rectocele  Discharge diagnosis: Rectocele  Secondary diagnoses: Cystocele  History and Physical: For full details, please see admission history and physical. Briefly, Sophia Bailey is a 67 y.o. year old patient with above diagnosis.   Hospital Course: Surgery and post op course uneventful. Labs and course normal  Laboratory values:  Recent Labs  09/16/13 1619 09/17/13 0340  HGB 12.9 12.7  HCT 37.9 37.6    Recent Labs  09/17/13 0340  CREATININE 0.76    Disposition: Home  Discharge instruction: The patient was instructed to be ambulatory but told to refrain from heavy lifting, strenuous activity, or driving. reviewed  Discharge medications:    Medication List    STOP taking these medications       ibuprofen 200 MG tablet  Commonly known as:  ADVIL,MOTRIN      TAKE these medications       cholecalciferol 1000 UNITS tablet  Commonly known as:  VITAMIN D  Take 1,000 Units by mouth daily.     fexofenadine 180 MG tablet  Commonly known as:  ALLEGRA  Take 180 mg by mouth daily.     lisinopril-hydrochlorothiazide 20-25 MG per tablet  Commonly known as:  PRINZIDE,ZESTORETIC  Take 1 tablet by mouth every morning.     omeprazole 20 MG tablet  Commonly known as:  PRILOSEC OTC  Take 20 mg by mouth daily.     OVER THE COUNTER MEDICATION  Take 2 tablets by mouth every other day. Cholesterice- OVER THE COUNTER CHOLESTEROL MEDICATION     oxyCODONE-acetaminophen 5-325 MG per tablet  Commonly known as:  ROXICET  Take 1-2 tablets by mouth every 6 (six) hours as needed for severe pain.     Potassium Gluconate 595 MG Caps  Take 1 capsule by mouth daily.     pravastatin 10 MG tablet  Commonly known as:  PRAVACHOL  Take 10 mg by mouth every other day.     rizatriptan 10 MG tablet  Commonly known as:  MAXALT  Take 10 mg by mouth as needed for migraine. May repeat in 2 hours if needed         Followup:      Follow-up Information   Follow up with Veronique Warga A, MD. (office will call you with appt date and time)    Specialty:  Urology   Contact information:   [address] [facility]  [place] [address] [phone]       Follow up with Koren Plyler A, MD. (as scheduled)    Specialty:  Urology   Contact information:   [address] [facility]  [place] [address] [phone]       

## 2013-10-01 DIAGNOSIS — N816 Rectocele: Secondary | ICD-10-CM | POA: Diagnosis not present

## 2013-10-01 DIAGNOSIS — R82998 Other abnormal findings in urine: Secondary | ICD-10-CM | POA: Diagnosis not present

## 2013-10-01 DIAGNOSIS — R350 Frequency of micturition: Secondary | ICD-10-CM | POA: Diagnosis not present

## 2013-10-23 DIAGNOSIS — N951 Menopausal and female climacteric states: Secondary | ICD-10-CM | POA: Diagnosis not present

## 2013-12-10 DIAGNOSIS — E7800 Pure hypercholesterolemia, unspecified: Secondary | ICD-10-CM | POA: Diagnosis not present

## 2013-12-10 DIAGNOSIS — N951 Menopausal and female climacteric states: Secondary | ICD-10-CM | POA: Diagnosis not present

## 2013-12-10 DIAGNOSIS — M25569 Pain in unspecified knee: Secondary | ICD-10-CM | POA: Diagnosis not present

## 2013-12-10 DIAGNOSIS — M7150 Other bursitis, not elsewhere classified, unspecified site: Secondary | ICD-10-CM | POA: Diagnosis not present

## 2013-12-10 DIAGNOSIS — I10 Essential (primary) hypertension: Secondary | ICD-10-CM | POA: Diagnosis not present

## 2013-12-11 DIAGNOSIS — N816 Rectocele: Secondary | ICD-10-CM | POA: Diagnosis not present

## 2013-12-16 DIAGNOSIS — M25569 Pain in unspecified knee: Secondary | ICD-10-CM | POA: Diagnosis not present

## 2014-02-05 DIAGNOSIS — H2510 Age-related nuclear cataract, unspecified eye: Secondary | ICD-10-CM | POA: Diagnosis not present

## 2014-02-05 DIAGNOSIS — D3130 Benign neoplasm of unspecified choroid: Secondary | ICD-10-CM | POA: Diagnosis not present

## 2014-04-07 DIAGNOSIS — Z23 Encounter for immunization: Secondary | ICD-10-CM | POA: Diagnosis not present

## 2014-07-20 DIAGNOSIS — E785 Hyperlipidemia, unspecified: Secondary | ICD-10-CM | POA: Diagnosis not present

## 2014-07-20 DIAGNOSIS — G43909 Migraine, unspecified, not intractable, without status migrainosus: Secondary | ICD-10-CM | POA: Diagnosis not present

## 2014-07-20 DIAGNOSIS — Z0000 Encounter for general adult medical examination without abnormal findings: Secondary | ICD-10-CM | POA: Diagnosis not present

## 2014-07-20 DIAGNOSIS — E669 Obesity, unspecified: Secondary | ICD-10-CM | POA: Diagnosis not present

## 2014-07-20 DIAGNOSIS — Z111 Encounter for screening for respiratory tuberculosis: Secondary | ICD-10-CM | POA: Diagnosis not present

## 2014-07-20 DIAGNOSIS — I10 Essential (primary) hypertension: Secondary | ICD-10-CM | POA: Diagnosis not present

## 2014-07-20 DIAGNOSIS — F3341 Major depressive disorder, recurrent, in partial remission: Secondary | ICD-10-CM | POA: Diagnosis not present

## 2014-07-20 DIAGNOSIS — Z23 Encounter for immunization: Secondary | ICD-10-CM | POA: Diagnosis not present

## 2014-07-23 DIAGNOSIS — H2513 Age-related nuclear cataract, bilateral: Secondary | ICD-10-CM | POA: Diagnosis not present

## 2014-07-23 DIAGNOSIS — H21233 Degeneration of iris (pigmentary), bilateral: Secondary | ICD-10-CM | POA: Diagnosis not present

## 2014-07-29 DIAGNOSIS — Z1231 Encounter for screening mammogram for malignant neoplasm of breast: Secondary | ICD-10-CM | POA: Diagnosis not present

## 2014-07-29 DIAGNOSIS — Z853 Personal history of malignant neoplasm of breast: Secondary | ICD-10-CM | POA: Diagnosis not present

## 2014-07-29 DIAGNOSIS — M8580 Other specified disorders of bone density and structure, unspecified site: Secondary | ICD-10-CM | POA: Diagnosis not present

## 2015-01-20 DIAGNOSIS — E663 Overweight: Secondary | ICD-10-CM | POA: Diagnosis not present

## 2015-01-20 DIAGNOSIS — E785 Hyperlipidemia, unspecified: Secondary | ICD-10-CM | POA: Diagnosis not present

## 2015-01-20 DIAGNOSIS — I10 Essential (primary) hypertension: Secondary | ICD-10-CM | POA: Diagnosis not present

## 2015-01-20 DIAGNOSIS — G43909 Migraine, unspecified, not intractable, without status migrainosus: Secondary | ICD-10-CM | POA: Diagnosis not present

## 2015-04-26 DIAGNOSIS — Z23 Encounter for immunization: Secondary | ICD-10-CM | POA: Diagnosis not present

## 2015-06-02 DIAGNOSIS — M180 Bilateral primary osteoarthritis of first carpometacarpal joints: Secondary | ICD-10-CM | POA: Diagnosis not present

## 2015-06-02 DIAGNOSIS — M79645 Pain in left finger(s): Secondary | ICD-10-CM | POA: Diagnosis not present

## 2015-06-02 DIAGNOSIS — M79644 Pain in right finger(s): Secondary | ICD-10-CM | POA: Diagnosis not present

## 2015-08-05 IMAGING — CT CT CHEST W/ CM
2 of 3 series · 15 of 36 positions shown, 18 images · IV contrast (OMNIPAQUE)
Comparison: DG CHEST 2 VIEW dated 09/08/2013

CLINICAL DATA: Abnormal preop chest x-ray.

EXAM:
CT CHEST WITH CONTRAST
TECHNIQUE: Multidetector CT imaging of the chest was performed during
intravenous contrast administration.
CONTRAST:  80mL OMNIPAQUE IOHEXOL 300 MG/ML  SOLN

[Series 2: chest with st · axial · 0.74mm/px · z∈[-186,+69]mm · 12 of 61 slices shown, 15 images]
[im 5/61  mediastinal]
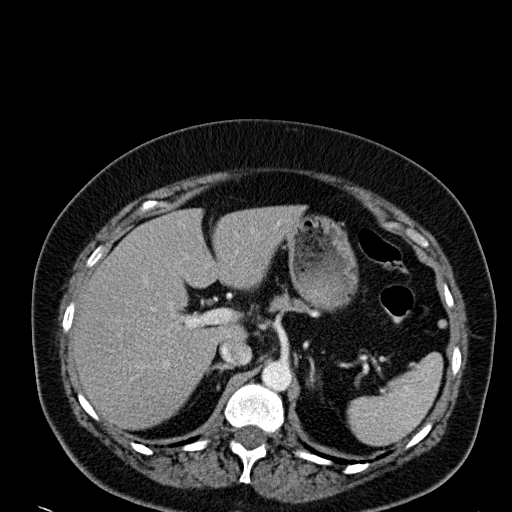
[im 5/61  lung]
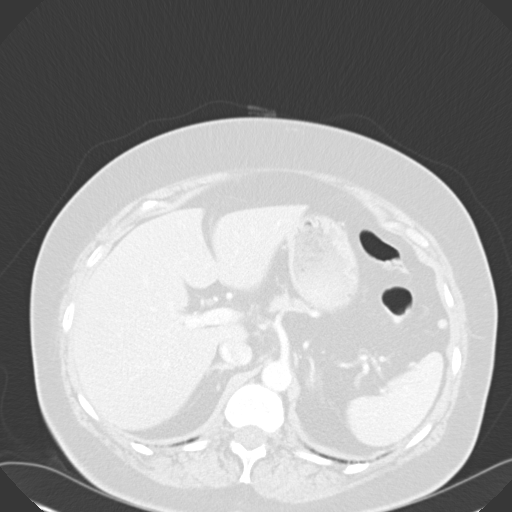
[im 9/61  lung]
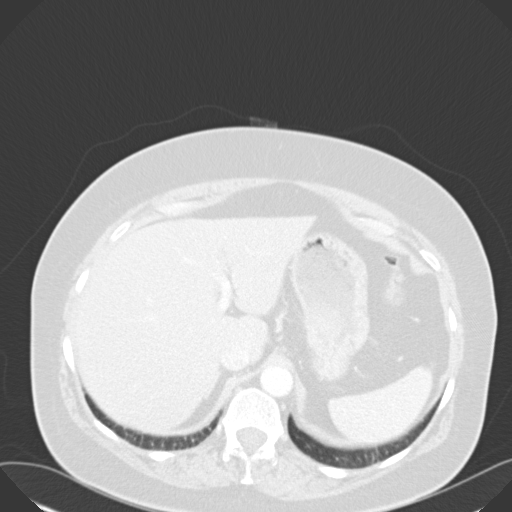
[im 14/61  lung]
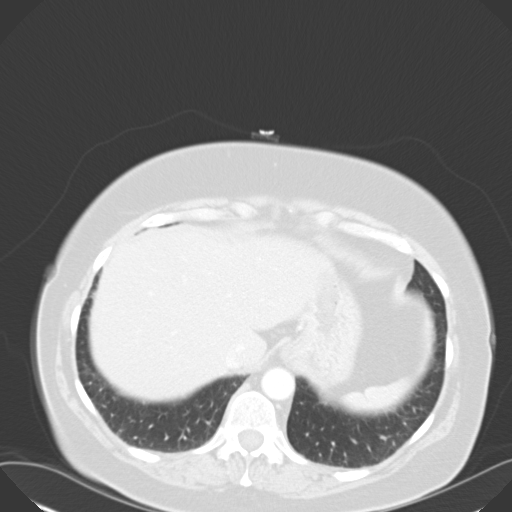
[im 18/61  lung]
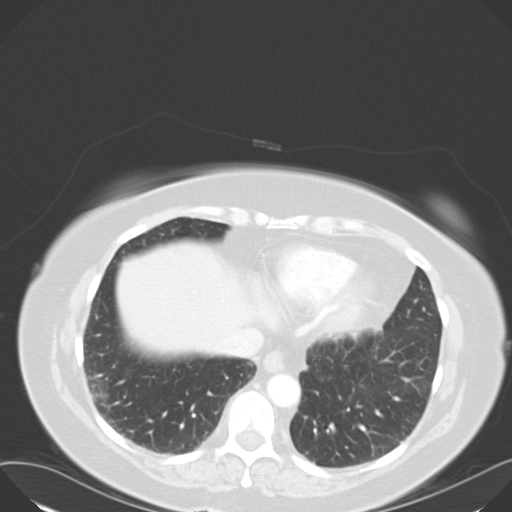
[im 23/61  mediastinal]
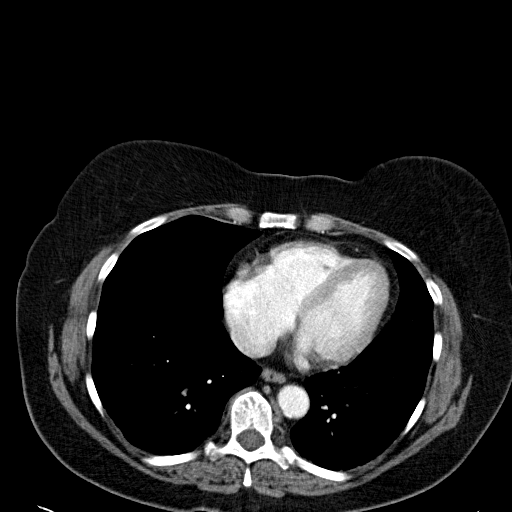
[im 23/61  lung]
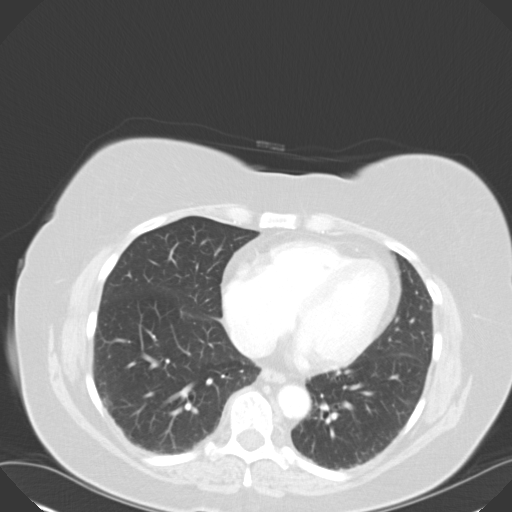
[im 27/61  lung]
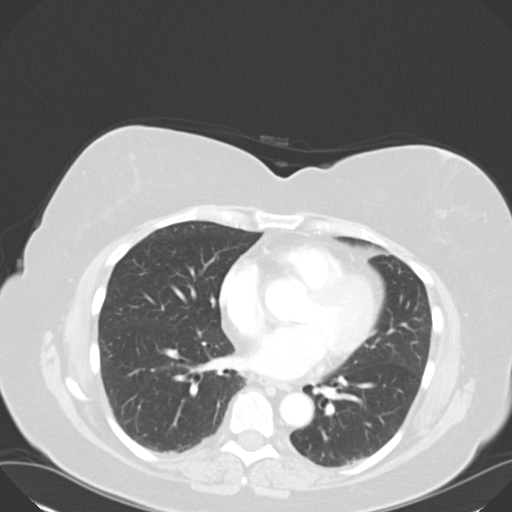
[im 34/61  lung]
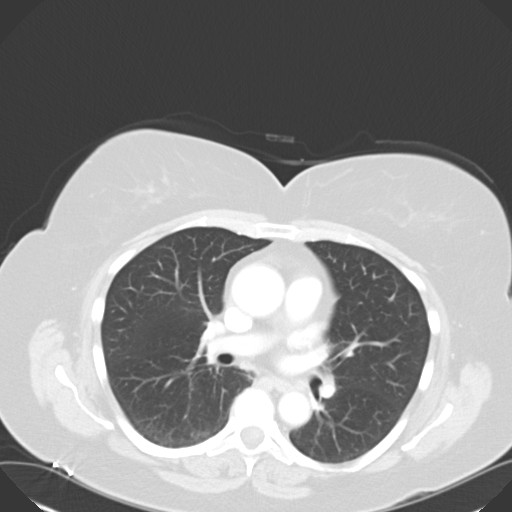
[im 38/61  lung]
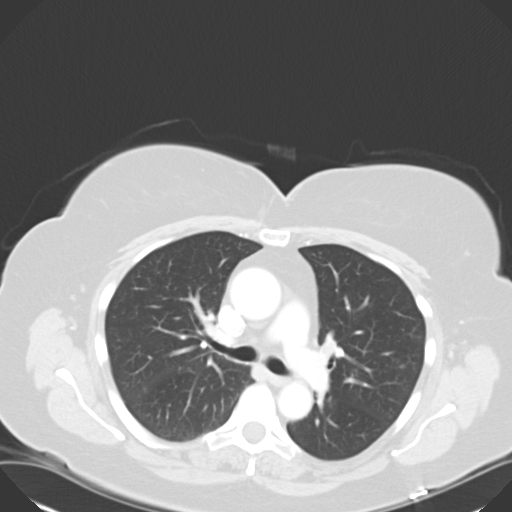
[im 43/61  mediastinal]
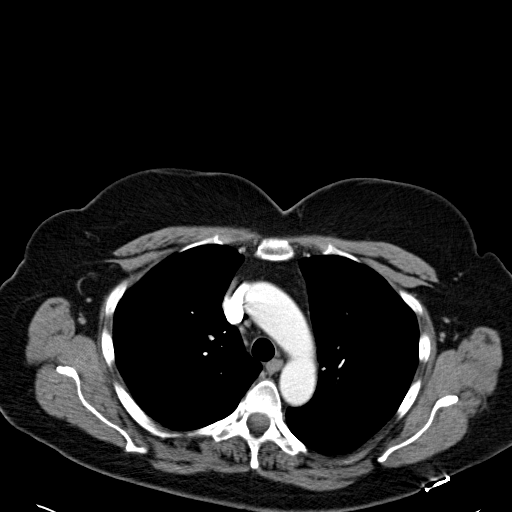
[im 43/61  lung]
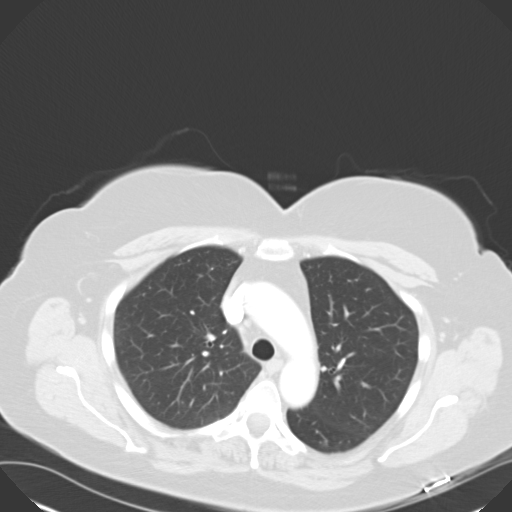
[im 47/61  lung]
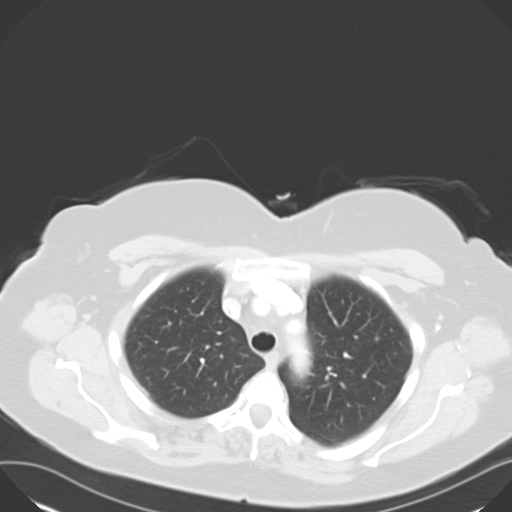
[im 52/61  lung]
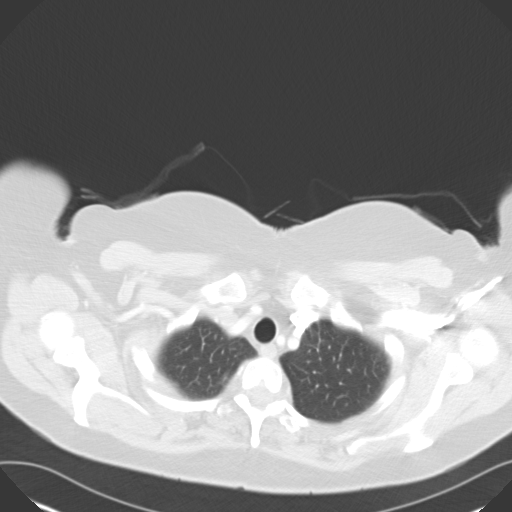
[im 56/61  lung]
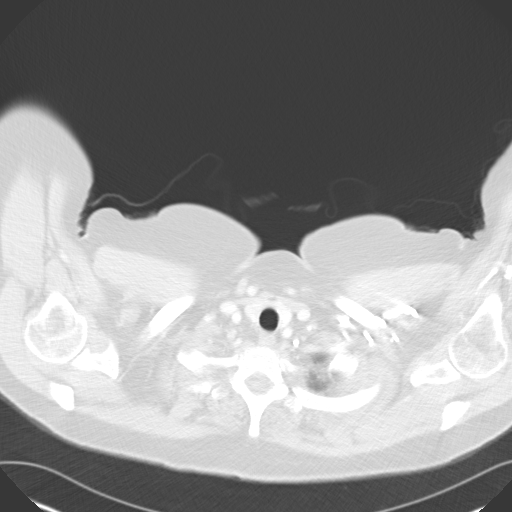

[Series 602: <mpr thick range> · coronal · 0.74mm/px · 3 of 83 slices shown]
[im 17/83  lung]
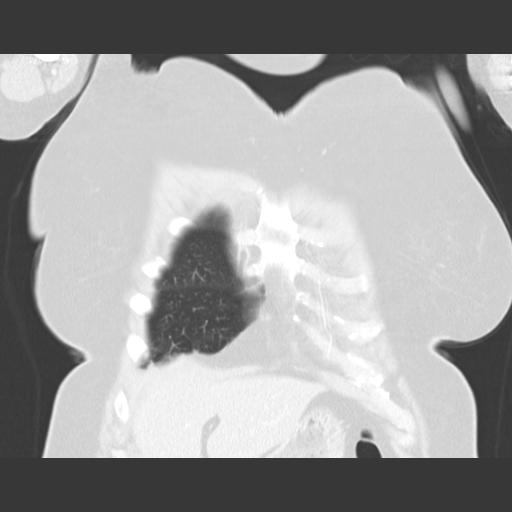
[im 33/83  lung]
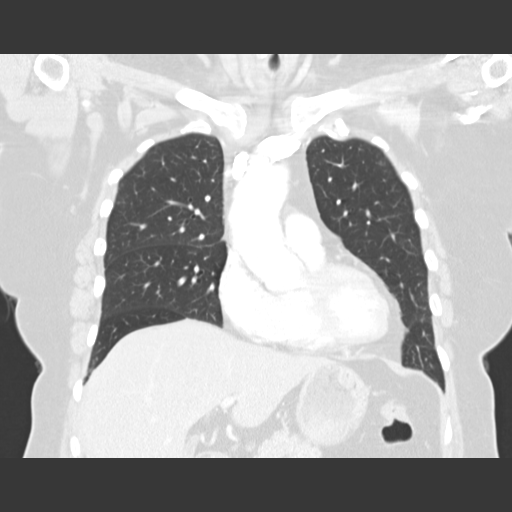
[im 50/83  lung]
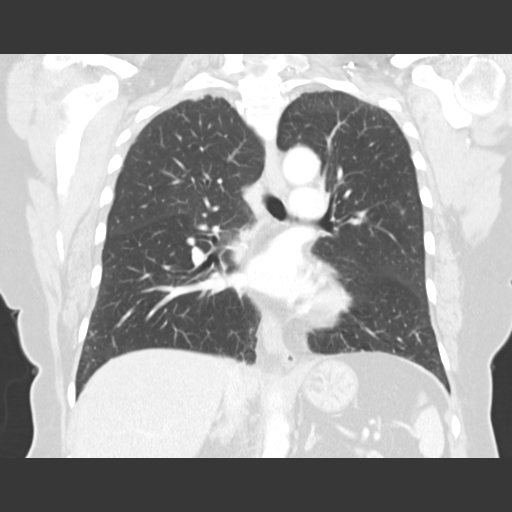

[15 of 36 positions shown; findings below may reference images not displayed]

FINDINGS: Heart is normal size. Mildly ectatic and prominent ascending
thoracic aorta measuring maximally 3.9 cm. There is no mediastinal,
hilar or axillary adenopathy.

Lungs are clear. No focal airspace opacities or suspicious nodules.
No effusions. Chest wall soft tissues are unremarkable. Imaging into
the upper abdomen shows no acute findings.

No acute bony abnormality.
IMPRESSION: Mildly ectatic ascending thoracic aorta may be the cause of the
prominent mediastinum on chest x-ray. Ascending aorta measures
cm. No aneurysm or dissection. No mediastinal adenopathy.

## 2015-10-07 DIAGNOSIS — L820 Inflamed seborrheic keratosis: Secondary | ICD-10-CM | POA: Diagnosis not present

## 2015-10-07 DIAGNOSIS — L814 Other melanin hyperpigmentation: Secondary | ICD-10-CM | POA: Diagnosis not present

## 2015-10-07 DIAGNOSIS — L821 Other seborrheic keratosis: Secondary | ICD-10-CM | POA: Diagnosis not present

## 2015-10-22 DIAGNOSIS — E538 Deficiency of other specified B group vitamins: Secondary | ICD-10-CM | POA: Diagnosis not present

## 2015-10-22 DIAGNOSIS — Z0000 Encounter for general adult medical examination without abnormal findings: Secondary | ICD-10-CM | POA: Diagnosis not present

## 2015-10-22 DIAGNOSIS — I10 Essential (primary) hypertension: Secondary | ICD-10-CM | POA: Diagnosis not present

## 2015-10-22 DIAGNOSIS — G629 Polyneuropathy, unspecified: Secondary | ICD-10-CM | POA: Diagnosis not present

## 2015-10-22 DIAGNOSIS — E785 Hyperlipidemia, unspecified: Secondary | ICD-10-CM | POA: Diagnosis not present

## 2015-10-22 DIAGNOSIS — Z1159 Encounter for screening for other viral diseases: Secondary | ICD-10-CM | POA: Diagnosis not present

## 2015-11-19 DIAGNOSIS — Z1231 Encounter for screening mammogram for malignant neoplasm of breast: Secondary | ICD-10-CM | POA: Diagnosis not present

## 2015-11-19 DIAGNOSIS — Z803 Family history of malignant neoplasm of breast: Secondary | ICD-10-CM | POA: Diagnosis not present

## 2015-12-29 DIAGNOSIS — M25562 Pain in left knee: Secondary | ICD-10-CM | POA: Diagnosis not present

## 2016-01-19 DIAGNOSIS — M25562 Pain in left knee: Secondary | ICD-10-CM | POA: Diagnosis not present

## 2016-01-26 DIAGNOSIS — M25561 Pain in right knee: Secondary | ICD-10-CM | POA: Diagnosis not present

## 2016-03-07 DIAGNOSIS — Z23 Encounter for immunization: Secondary | ICD-10-CM | POA: Diagnosis not present

## 2016-05-15 DIAGNOSIS — H21233 Degeneration of iris (pigmentary), bilateral: Secondary | ICD-10-CM | POA: Diagnosis not present

## 2016-05-15 DIAGNOSIS — Z0100 Encounter for examination of eyes and vision without abnormal findings: Secondary | ICD-10-CM | POA: Diagnosis not present

## 2016-05-15 DIAGNOSIS — H2513 Age-related nuclear cataract, bilateral: Secondary | ICD-10-CM | POA: Diagnosis not present

## 2016-08-07 DIAGNOSIS — R42 Dizziness and giddiness: Secondary | ICD-10-CM | POA: Diagnosis not present

## 2016-08-07 DIAGNOSIS — R404 Transient alteration of awareness: Secondary | ICD-10-CM | POA: Diagnosis not present

## 2016-10-24 DIAGNOSIS — J018 Other acute sinusitis: Secondary | ICD-10-CM | POA: Diagnosis not present

## 2016-11-01 ENCOUNTER — Encounter: Payer: Self-pay | Admitting: Neurology

## 2016-11-01 DIAGNOSIS — G43909 Migraine, unspecified, not intractable, without status migrainosus: Secondary | ICD-10-CM | POA: Diagnosis not present

## 2016-11-01 DIAGNOSIS — F3341 Major depressive disorder, recurrent, in partial remission: Secondary | ICD-10-CM | POA: Diagnosis not present

## 2016-11-01 DIAGNOSIS — E785 Hyperlipidemia, unspecified: Secondary | ICD-10-CM | POA: Diagnosis not present

## 2016-11-01 DIAGNOSIS — E538 Deficiency of other specified B group vitamins: Secondary | ICD-10-CM | POA: Diagnosis not present

## 2016-11-01 DIAGNOSIS — Z0000 Encounter for general adult medical examination without abnormal findings: Secondary | ICD-10-CM | POA: Diagnosis not present

## 2016-11-01 DIAGNOSIS — I10 Essential (primary) hypertension: Secondary | ICD-10-CM | POA: Diagnosis not present

## 2016-11-01 DIAGNOSIS — R05 Cough: Secondary | ICD-10-CM | POA: Diagnosis not present

## 2016-12-14 DIAGNOSIS — M8588 Other specified disorders of bone density and structure, other site: Secondary | ICD-10-CM | POA: Diagnosis not present

## 2016-12-14 DIAGNOSIS — E2839 Other primary ovarian failure: Secondary | ICD-10-CM | POA: Diagnosis not present

## 2016-12-14 DIAGNOSIS — Z1231 Encounter for screening mammogram for malignant neoplasm of breast: Secondary | ICD-10-CM | POA: Diagnosis not present

## 2016-12-29 DIAGNOSIS — H21233 Degeneration of iris (pigmentary), bilateral: Secondary | ICD-10-CM | POA: Diagnosis not present

## 2016-12-29 DIAGNOSIS — H2513 Age-related nuclear cataract, bilateral: Secondary | ICD-10-CM | POA: Diagnosis not present

## 2016-12-29 DIAGNOSIS — H5213 Myopia, bilateral: Secondary | ICD-10-CM | POA: Diagnosis not present

## 2017-01-24 ENCOUNTER — Encounter: Payer: Self-pay | Admitting: Neurology

## 2017-01-24 ENCOUNTER — Ambulatory Visit (INDEPENDENT_AMBULATORY_CARE_PROVIDER_SITE_OTHER): Payer: Medicare Other | Admitting: Neurology

## 2017-01-24 ENCOUNTER — Other Ambulatory Visit: Payer: Medicare Other

## 2017-01-24 VITALS — BP 136/94 | HR 66 | Ht 64.0 in | Wt 184.7 lb

## 2017-01-24 DIAGNOSIS — R200 Anesthesia of skin: Secondary | ICD-10-CM

## 2017-01-24 DIAGNOSIS — G2581 Restless legs syndrome: Secondary | ICD-10-CM

## 2017-01-24 NOTE — Progress Notes (Signed)
Sophia Bailey - Initial Visit   Date: 01/24/17  Sophia Bailey MRN: [id number] DOB: February 05, 1946   Dear Dr. Justin Mend:  Thank you for your kind referral of Sophia Bailey for consultation of neuropathy. Although her history is well known to you, please allow [place] to reiterate it for the purpose of our medical record. The patient was accompanied to the clinic by self.    History of Present Illness: Sophia Bailey is a 71 y.o. left-handed Caucasian female with hypertension, hyperlipidemia, migraine, and GERD presenting for evaluation of neuropathy.   She is very concerned about neuropathy because of family history.  She complains of shooting pain and crawling senation which starts in her feet and then involves her upper legs and thighs.  It mostly occurs a night about 3-4 times per week, lasting about an hour.  Sometimes walking can help.  She was told she may have restless leg syndrome.  She has tried gabapentin and discontinued this due to anxiety.    She also has numbness at the tip of the 2nd toe on the left >> right which has been like this for 4 years.  She denies any tingling pain.  No associated weakness or imbalance.     Her mother was diagnosed with neuropathy in her 110s due to peripheral vascular disease and unable to perceive sensation below her knees.  She was able to ambulate without assistance until the age of 50.  She passed away at the age of 87 from pneumonia. Her brother was diagnosed with peripheral neuropathy in his 55s who had toe amputation due to non-healing ulcaration.  He is currently 8 and walks unassisted. She is not aware of him being diagnosed with hereditary neuropathy.   No personal or family history of diabetes or alcoholism.   Past Medical History:  Diagnosis Date  . Complication of anesthesia   . GERD (gastroesophageal reflux disease)   . Headache(784.0)   . Hepatitis    as child  . Hot flashes    at night  .  Hyperlipidemia   . Hypertension   . PONV (postoperative nausea and vomiting)   . Urinary incontinence     Past Surgical History:  Procedure Laterality Date  . ABDOMINAL HYSTERECTOMY  1991  . BUNIONECTOMY  2012  . RECTOCELE REPAIR N/A 09/16/2013   Procedure: POSTERIOR REPAIR (RECTOCELE)WITH CYSTOSCOPY;  Surgeon: Reece Packer, MD;  Location: [facility];  Service: Urology;  Laterality: N/A;  . TUBAL LIGATION     37 YRS AGO     Medications:  Outpatient Encounter Prescriptions as of 01/24/2017  Medication Sig Bailey  . cholecalciferol (VITAMIN D) 1000 UNITS tablet Take 1,000 Units by mouth daily. 09/03/2013: Stopped for procedure   . fexofenadine (ALLEGRA) 180 MG tablet Take 180 mg by mouth daily. 09/03/2013: Stopped for procedure   . lisinopril-hydrochlorothiazide (PRINZIDE,ZESTORETIC) 20-25 MG per tablet Take 1 tablet by mouth every morning.   Marland Kitchen omeprazole (PRILOSEC OTC) 20 MG tablet Take 20 mg by mouth daily. 09/03/2013: Stopped for procedure   . rizatriptan (MAXALT) 10 MG tablet Take 10 mg by mouth as needed for migraine. May repeat in 2 hours if needed   . [DISCONTINUED] OVER THE COUNTER MEDICATION Take 2 tablets by mouth every other day. Cholesterice- OVER THE COUNTER CHOLESTEROL MEDICATION 09/03/2013: Stopped for procedure   . [DISCONTINUED] oxyCODONE-acetaminophen (ROXICET) 5-325 MG per tablet Take 1-2 tablets by mouth every 6 (six) hours as needed for severe pain.   . [  DISCONTINUED] Potassium Gluconate 595 MG CAPS Take 1 capsule by mouth daily. 09/03/2013: Stopped for procedure   . [DISCONTINUED] pravastatin (PRAVACHOL) 10 MG tablet Take 10 mg by mouth every other day.    No facility-administered encounter medications on file as of 01/24/2017.      Allergies:  Allergies  Allergen Reactions  . Codeine Nausea And Vomiting  . Demerol [Meperidine]     BP drops  . Hydrocodone Nausea And Vomiting    Family History: Family History  Problem Relation Age of Onset  . Neuropathy Mother     . Pneumonia Mother   . Congestive Heart Failure Father   . Alcoholism Sister   . Uterine cancer Maternal Grandmother   . Parkinsonism Maternal Grandfather   . Neuropathy Maternal Grandfather   . Heart attack Paternal Grandmother   . Liver disease Paternal Grandfather   . Coronary artery disease Sister   . Hypertension Sister   . Neuropathy Brother     Social History: Social History  Substance Use Topics  . Smoking status: Never Smoker  . Smokeless tobacco: Never Used  . Alcohol use Yes     Comment: rare   Social History   Social History Narrative  . No narrative on file    Review of Systems:  CONSTITUTIONAL: No fevers, chills, night sweats, or weight loss.   EYES: No visual changes or eye pain ENT: No hearing changes.  No history of nose bleeds.   RESPIRATORY: No cough, wheezing and shortness of breath.   CARDIOVASCULAR: Negative for chest pain, and palpitations.   GI: Negative for abdominal discomfort, blood in stools or black stools.  No recent change in bowel habits.   GU:  No history of incontinence.   MUSCLOSKELETAL: No history of joint pain or swelling.  No myalgias.   SKIN: Negative for lesions, rash, and itching.   HEMATOLOGY/ONCOLOGY: Negative for prolonged bleeding, bruising easily, and swollen nodes.  No history of cancer.   ENDOCRINE: Negative for cold or heat intolerance, polydipsia or goiter.   PSYCH:  No depression +anxiety symptoms.   NEURO: As Above.   Vital Signs:  BP (!) 136/94   Pulse 66   Ht 5\' 4"  (1.626 m)   Wt 184 lb 11.2 oz (83.8 kg)   SpO2 98%   BMI 31.70 kg/m    General Medical Exam:   General:  Well appearing, comfortable.   Eyes/ENT: see cranial nerve examination.   Neck: No masses appreciated.  Full range of motion without tenderness.  No carotid bruits. Respiratory:  Clear to auscultation, good air entry bilaterally.   Cardiac:  Regular rate and rhythm, no murmur.   Extremities:  No deformities, edema, or skin discoloration.   Skin:  No rashes or lesions.  Neurological Exam: MENTAL STATUS including orientation to time, place, person, recent and remote memory, attention span and concentration, language, and fund of knowledge is normal.  Speech is not dysarthric.  CRANIAL NERVES: II:  No visual field defects.  Unremarkable fundi.   III-IV-VI: Pupils equal round and reactive to light.  Normal conjugate, extra-ocular eye movements in all directions of gaze.  No nystagmus.  No ptosis.   V:  Normal facial sensation.     VII:  Normal facial symmetry and movements.    VIII:  Normal hearing and vestibular function.   IX-X:  Normal palatal movement.   XI:  Normal shoulder shrug and head rotation.   XII:  Normal tongue strength and range of motion, no deviation or  fasciculation.  MOTOR:  No atrophy, fasciculations or abnormal movements.  No pronator drift.  Tone is normal.    Right Upper Extremity:    Left Upper Extremity:    Deltoid  5/5   Deltoid  5/5   Biceps  5/5   Biceps  5/5   Triceps  5/5   Triceps  5/5   Wrist extensors  5/5   Wrist extensors  5/5   Wrist flexors  5/5   Wrist flexors  5/5   Finger extensors  5/5   Finger extensors  5/5   Finger flexors  5/5   Finger flexors  5/5   Dorsal interossei  5/5   Dorsal interossei  5/5   Abductor pollicis  5/5   Abductor pollicis  5/5   Tone (Ashworth scale)  0  Tone (Ashworth scale)  0   Right Lower Extremity:    Left Lower Extremity:    Hip flexors  5/5   Hip flexors  5/5   Hip extensors  5/5   Hip extensors  5/5   Knee flexors  5/5   Knee flexors  5/5   Knee extensors  5/5   Knee extensors  5/5   Dorsiflexors  5/5   Dorsiflexors  5/5   Plantarflexors  5/5   Plantarflexors  5/5   Toe extensors  5/5   Toe extensors  5/5   Toe flexors  5/5   Toe flexors  5/5   Tone (Ashworth scale)  0  Tone (Ashworth scale)  0   MSRs:  Right                                                                 Left brachioradialis 2+  brachioradialis 2+  biceps 2+  biceps 2+   triceps 2+  triceps 2+  patellar 2+  patellar 2+  ankle jerk 2+  ankle jerk 2+  Hoffman no  Hoffman no  plantar response down  plantar response down   SENSORY:  Pin prick is mildly reduced only at the tip of the 2nd toe on the left.  Vibration, temperature, and light touch is intact throughout. Romberg's sign absent.   COORDINATION/GAIT: Normal finger-to- nose-finger and heel-to-shin.  Intact rapid alternating movements bilaterally.  Able to rise from a chair without using arms.  Gait narrow based and stable. Tandem and stressed gait intact.    IMPRESSION: 1.  Restless leg syndrome manifesting with episodic resting paresthesias of the legs.  Reassured patient that neuropathy does not manifesting with fleeting rest pain involving the entire lower extremity. Check ferritin level.  She did not tolerate gabapentin previously.  Consider dopamine agonist going forward.   2.  Bilateral 2nd toe numbness may either represent early and distal neuropathy or Morton's neuroma, given that she has such localized numbness.  NCS/EMG will be performed to better characterize the nature of her symptoms.  Check vitamin B12, MMA, copper, folate.  If her electrodiagnostic testing favors neuropathy, additional labs will be checked.  Further recommendations will be based on the results of her NCS/EMG  The duration of this appointment visit was 45 minutes of face-to-face time with the patient.  Greater than 50% of this time was spent in counseling, explanation of diagnosis, planning of further management, and  coordination of care.   Thank you for allowing me to participate in patient's care.  If I can answer any additional questions, I would be pleased to do so.    Sincerely,    Rawan Riendeau K. Posey Pronto, DO

## 2017-01-24 NOTE — Patient Instructions (Signed)
1. Nerve testing of the legs 2. Check labs  We will call you with the results of your testing and let you know the next step 

## 2017-01-25 ENCOUNTER — Telehealth: Payer: Self-pay | Admitting: Neurology

## 2017-01-25 ENCOUNTER — Ambulatory Visit (INDEPENDENT_AMBULATORY_CARE_PROVIDER_SITE_OTHER): Payer: Medicare Other | Admitting: Neurology

## 2017-01-25 DIAGNOSIS — G2581 Restless legs syndrome: Secondary | ICD-10-CM

## 2017-01-25 DIAGNOSIS — R200 Anesthesia of skin: Secondary | ICD-10-CM

## 2017-01-25 LAB — FERRITIN: Ferritin: 28 ng/mL (ref 20–288)

## 2017-01-25 LAB — VITAMIN B12: Vitamin B-12: 830 pg/mL (ref 200–1100)

## 2017-01-25 LAB — FOLATE: FOLATE: 12.8 ng/mL (ref >5.4)

## 2017-01-25 NOTE — Procedures (Signed)
[facility]  [place], [address]  [place], [place] [address] Tel: [phone] Fax:  [phone] Test Date:  01/25/2017  Patient: Sophia Bailey DOB: 1945/12/26 Physician: Narda Amber, DO  Sex: Female Height: 5\' 4"  Ref Phys: Narda Amber, DO  ID#: [id number] Temp: 34.3C Technician:    Patient Complaints: This is a 71 year-old female referred for evaluation of sporadic paresthesias of the legs and numbness of the feet.  NCV & EMG Findings: Extensive electrodiagnostic testing of the right lower extremity and additional studies of the left shows:  1. Bilateral sural and superficial peroneal sensory responses are within normal limits. 2. Bilateral peroneal and tibial motor responses are within normal limits. 3. Right tibial H reflex study is within normal limits. 4. There is no evidence of active or chronic motor axon loss changes affecting any of the tested muscles. Motor unit configuration and recruitment pattern is within normal limits.  Impression: This is a normal study of the lower extremities. In particular, there is no evidence of a generalized sensorimotor polyneuropathy or lumbosacral radiculopathy.   ___________________________ Narda Amber, DO    Nerve Conduction Studies Anti Sensory Summary Table   Site NR Peak (ms) Norm Peak (ms) P-T Amp (V) Norm P-T Amp  Left Sup Peroneal Anti Sensory (Ant Lat Mall)  34.3C  12 cm    2.4 <4.6 8.5 >3  Right Sup Peroneal Anti Sensory (Ant Lat Mall)  34.3C  12 cm    2.9 <4.6 7.1 >3  Left Sural Anti Sensory (Lat Mall)  34.3C  Calf    3.3 <4.6 8.9 >3  Right Sural Anti Sensory (Lat Mall)  34.3C  Calf    3.3 <4.6 8.9 >3   Motor Summary Table   Site NR Onset (ms) Norm Onset (ms) O-P Amp (mV) Norm O-P Amp Site1 Site2 Delta-0 (ms) Dist (cm) Vel (m/s) Norm Vel (m/s)  Left Peroneal Motor (Ext Dig Brev)  34.3C  Ankle    3.3 <6.0 4.6 >2.5 B Fib Ankle 6.9 36.0 52 >40  B Fib    10.2  4.4  Poplt B Fib 1.1 8.0 73 >40    Poplt    11.3  4.4         Right Peroneal Motor (Ext Dig Brev)  34.3C  Ankle    3.0 <6.0 4.0 >2.5 B Fib Ankle 7.0 37.0 53 >40  B Fib    10.0  3.9  Poplt B Fib 1.3 9.0 69 >40  Poplt    11.3  3.8         Left Tibial Motor (Abd Hall Brev)  34.3C  Ankle    3.1 <6.0 11.1 >4 Knee Ankle 8.5 37.0 44 >40  Knee    11.6  6.6         Right Tibial Motor (Abd Hall Brev)  34.3C  Ankle    2.8 <6.0 12.8 >4 Knee Ankle 8.4 37.0 44 >40  Knee    11.2  9.0          H Reflex Studies   NR H-Lat (ms) Lat Norm (ms) L-R H-Lat (ms)  Right Tibial (Gastroc)  34.3C     33.06 <35    EMG   Side Muscle Ins Act Fibs Psw Fasc Number Recrt Dur Dur. Amp Amp. Poly Poly. Comment  Right AntTibialis Nml Nml Nml Nml Nml Nml Nml Nml Nml Nml Nml Nml N/A  Right Gastroc Nml Nml Nml Nml Nml Nml Nml Nml Nml Nml Nml Nml N/A  Right Flex Dig Long Nml Nml Nml Nml Nml Nml Nml Nml Nml Nml Nml Nml N/A  Right RectFemoris Nml Nml Nml Nml Nml Nml Nml Nml Nml Nml Nml Nml N/A  Right GluteusMed Nml Nml Nml Nml Nml Nml Nml Nml Nml Nml Nml Nml N/A  Left AntTibialis Nml Nml Nml Nml Nml Nml Nml Nml Nml Nml Nml Nml N/A  Left Gastroc Nml Nml Nml Nml Nml Nml Nml Nml Nml Nml Nml Nml N/A  Left Flex Dig Long Nml Nml Nml Nml Nml Nml Nml Nml Nml Nml Nml Nml N/A  Left RectFemoris Nml Nml Nml Nml Nml Nml Nml Nml Nml Nml Nml Nml N/A  Left GluteusMed Nml Nml Nml Nml Nml Nml Nml Nml Nml Nml Nml Nml N/A      Waveforms:

## 2017-01-25 NOTE — Telephone Encounter (Signed)
Patient informed that nerve testing was normal and did not show evidence of neuropathy.  Still awaiting labs, specifically ferritin, to treat restless leg syndrome.  Athea Haley K. Posey Pronto, DO

## 2017-01-30 DIAGNOSIS — H2511 Age-related nuclear cataract, right eye: Secondary | ICD-10-CM | POA: Diagnosis not present

## 2017-01-30 DIAGNOSIS — H25811 Combined forms of age-related cataract, right eye: Secondary | ICD-10-CM | POA: Diagnosis not present

## 2017-01-31 ENCOUNTER — Telehealth: Payer: Self-pay | Admitting: Neurology

## 2017-01-31 NOTE — Telephone Encounter (Signed)
Patient called needing to see if her Lab results were in? She was seen by Dr. Posey Pronto on 8/8 and 01/25/17. Please call. Thanks

## 2017-02-01 LAB — COPPER, FREE: Copper - Free, Serum/Plasma: 530 mcg/L

## 2017-02-01 NOTE — Telephone Encounter (Signed)
Patient informed that some of her labs are still pending but that I will call her with the results as soon as I get them in.

## 2017-02-05 ENCOUNTER — Telehealth: Payer: Self-pay | Admitting: *Deleted

## 2017-02-05 LAB — METHYLMALONIC ACID(MMA), RND URINE
Creatinine: 9.57 mmol/L (ref 2.38–26.55)
METHYLMALONIC ACID RANDOM UR MMOL/L: 0.8 mmol/mol{creat} (ref 0.3–1.9)

## 2017-02-05 NOTE — Telephone Encounter (Signed)
Patient given results and instructions and will come in for follow up on Nov. 12 at 11:30.

## 2017-02-05 NOTE — Telephone Encounter (Signed)
-----   Message from Alda Berthold, DO sent at 02/05/2017  8:06 AM EDT ----- Please inform patient to take ferrous sulfate 325mg  daily as her iron was low and this may be contributing to RLS.  The remaining labs are normal. She can call with an update in 1 month to see if we need to add medication for RLS.   I'd like to see her back in 3 months for f/u.

## 2017-04-03 DIAGNOSIS — H25812 Combined forms of age-related cataract, left eye: Secondary | ICD-10-CM | POA: Diagnosis not present

## 2017-04-03 DIAGNOSIS — H2512 Age-related nuclear cataract, left eye: Secondary | ICD-10-CM | POA: Diagnosis not present

## 2017-04-23 DIAGNOSIS — Z23 Encounter for immunization: Secondary | ICD-10-CM | POA: Diagnosis not present

## 2017-04-30 ENCOUNTER — Ambulatory Visit (INDEPENDENT_AMBULATORY_CARE_PROVIDER_SITE_OTHER): Payer: Medicare Other | Admitting: Neurology

## 2017-04-30 ENCOUNTER — Encounter: Payer: Self-pay | Admitting: Neurology

## 2017-04-30 VITALS — BP 120/80 | HR 63 | Ht 64.0 in | Wt 186.0 lb

## 2017-04-30 DIAGNOSIS — G2581 Restless legs syndrome: Secondary | ICD-10-CM | POA: Diagnosis not present

## 2017-04-30 DIAGNOSIS — G5763 Lesion of plantar nerve, bilateral lower limbs: Secondary | ICD-10-CM | POA: Insufficient documentation

## 2017-04-30 MED ORDER — CARBIDOPA-LEVODOPA 25-100 MG PO TABS: ORAL_TABLET | ORAL | 3 refills | Status: DC

## 2017-04-30 NOTE — Patient Instructions (Addendum)
Recommend reducing caffeine consumption  Start stretching and exercising before bedtime.   Start sinemet 25/100 take 0.5 - 1 tablet as needed for restless leg  Return to clinic in 6 months

## 2017-04-30 NOTE — Progress Notes (Signed)
Follow-up Visit   Date: 04/30/17    RAVLEEN RIES MRN: [id number] DOB: February 17, 1946   Interim History: Sophia Bailey is a 71 y.o. left-handed Caucasian female with hypertension, hyperlipidemia, migraine, and GERD returning to the clinic for follow-up of restless leg syndrome.  The patient was accompanied to the clinic by self.  History of present illness: She complains of shooting pain and crawling senation which starts in her feet and then involves her upper legs and thighs.  It mostly occurs a night about 3-4 times per week, lasting about an hour.  Sometimes walking can help.  She was told she may have restless leg syndrome.  She has tried gabapentin and discontinued this due to anxiety.    She also has numbness at the tip of the 2nd toe on the left >> right which has been like this for 4 years.  She denies any tingling pain.  No associated weakness or imbalance.     Her mother was diagnosed with neuropathy in her 39s due to peripheral vascular disease and unable to perceive sensation below her knees.  She was able to ambulate without assistance until the age of 35.  She passed away at the age of 62 from pneumonia. Her brother was diagnosed with peripheral neuropathy in his 25s who had toe amputation due to non-healing ulcaration.  He is currently 26 and walks unassisted. She is not aware of him being diagnosed with hereditary neuropathy.   No personal or family history of diabetes or alcoholism.  UPDATE 04/30/2017: She is here for follow-up visit.  Overall, she is doing well, but continues to have bilateral leg discomfort and crawling sensation. She prefers not to start any medication for symptoms. She is mostly bothered by this when she is at church and gets restless and does not feel comfortable getting up. Her feet numbness occurs intermittently now and is somewhat better.  Her NCS/EMG did not show evidence of neuropathy.    Medications:  Current Outpatient Medications on File  Prior to Visit  Medication Sig Dispense Refill  . cholecalciferol (VITAMIN D) 1000 UNITS tablet Take 1,000 Units by mouth daily.    . fexofenadine (ALLEGRA) 180 MG tablet Take 180 mg by mouth daily.    Marland Kitchen lisinopril-hydrochlorothiazide (PRINZIDE,ZESTORETIC) 20-25 MG per tablet Take 1 tablet by mouth every morning.    Marland Kitchen omeprazole (PRILOSEC OTC) 20 MG tablet Take 20 mg by mouth daily.    . rizatriptan (MAXALT) 10 MG tablet Take 10 mg by mouth as needed for migraine. May repeat in 2 hours if needed     No current facility-administered medications on file prior to visit.     Allergies:  Allergies  Allergen Reactions  . Codeine Nausea And Vomiting  . Demerol [Meperidine]     BP drops  . Hydrocodone Nausea And Vomiting    Review of Systems:  CONSTITUTIONAL: No fevers, chills, night sweats, or weight loss.  EYES: No visual changes or eye pain ENT: No hearing changes.  No history of nose bleeds.   RESPIRATORY: No cough, wheezing and shortness of breath.   CARDIOVASCULAR: Negative for chest pain, and palpitations.   GI: Negative for abdominal discomfort, blood in stools or black stools.  No recent change in bowel habits.   GU:  No history of incontinence.   MUSCLOSKELETAL: No history of joint pain or swelling.  No myalgias.   SKIN: Negative for lesions, rash, and itching.   ENDOCRINE: Negative for cold or heat intolerance, polydipsia  or goiter.   PSYCH:  No depression or anxiety symptoms.   NEURO: As Above.   Vital Signs:  BP 120/80   Pulse 63   Ht 5\' 4"  (1.626 m)   Wt 186 lb (84.4 kg)   SpO2 96%   BMI 31.93 kg/m    General: Well appearing, comfortable  Neurological Exam: MENTAL STATUS including orientation to time, place, person, recent and remote memory, attention span and concentration, language, and fund of knowledge is normal.  Speech is not dysarthric.  CRANIAL NERVES: Face is symmetric.   MOTOR:  Motor strength is 5/5 in all extremities  MSRs:  Reflexes are 2+/4  throughout  SENSORY:  Intact to vibration throughout.  COORDINATION/GAIT:    Gait narrow based and stable.   Data: NCS/EMG of the legs 01/25/2017:This is a normal study of the lower extremities. In particular, there is no evidence of a generalized sensorimotor polyneuropathy or lumbosacral radiculopathy.  Labs 01/24/2017:  Vitamin B12 830, MMA 0.8, copper 530, folate 12.8  Lab Results  Component Value Date   FERRITIN 28 01/24/2017     IMPRESSION/PLAN: 1.  Restless leg syndrome.  Her ferritin level was 28 and she started iron supplements but developed constipation.  Stool softners did not help and she has discontinued it.  Nonpharmacologic therapies discussed as she is not interested in starting dopamine agonists. Recommend reducing caffeine consumption, stretching and exercising before bedtime.  For breakthrough symptoms, start sinemet 25/100 0.5 - 1 tablet as needed.    2.  Bilateral 2nd toe numbness, most likely due to Morton's neuroma, given that she has such localized numbness.  NCS/EMG of the legs was normal, no evidence of neuropathy.  Clinically, this is slightly better and occurring only intermittently.  Return to clinic in 6 months   The duration of this appointment visit was 25 minutes of face-to-face time with the patient.  Greater than 50% of this time was spent in counseling, explanation of diagnosis, planning of further management, and coordination of care.   Thank you for allowing me to participate in patient's care.  If I can answer any additional questions, I would be pleased to do so.    Sincerely,    Donika K. Posey Pronto, DO

## 2017-08-09 DIAGNOSIS — M71341 Other bursal cyst, right hand: Secondary | ICD-10-CM | POA: Diagnosis not present

## 2017-08-14 DIAGNOSIS — M79641 Pain in right hand: Secondary | ICD-10-CM | POA: Diagnosis not present

## 2017-11-02 NOTE — Progress Notes (Signed)
Follow-up Visit   Date: 11/05/17    Sophia Bailey MRN: [id number] DOB: 1946-03-10   Interim History: Sophia Bailey is a 72 y.o. left-handed Caucasian female with hypertension, hyperlipidemia, migraine, and GERD returning to the clinic for follow-up of restless leg syndrome.  The patient was accompanied to the clinic by self.  History of present illness: She complains of shooting pain and crawling senation which starts in her feet and then involves her upper legs and thighs.  It mostly occurs a night about 3-4 times per week, lasting about an hour.  Sometimes walking can help.  She was told she may have restless leg syndrome.  She has tried gabapentin and discontinued this due to anxiety.    She also has numbness at the tip of the 2nd toe on the left >> right which has been like this for 4 years.    Her mother was diagnosed with neuropathy in her 29s due to peripheral vascular disease and unable to perceive sensation below her knees.  She was able to ambulate without assistance until the age of 40.  She passed away at the age of 55 from pneumonia. Her brother was diagnosed with peripheral neuropathy in his 75s who had toe amputation due to non-healing ulcaration.  He is currently 39 and walks unassisted. She is not aware of him being diagnosed with hereditary neuropathy.   She prefers not to start any medication for symptoms. She is mostly bothered by this when she is at church and gets restless and does not feel comfortable getting up. Her feet numbness occurs intermittently now and is somewhat better.  Her NCS/EMG did not show evidence of neuropathy.    UPDATE 11/02/2017:  She is here for follow-up visit for RLS.  She did not want to start dopamine agonist therapy at the last visit and offered sinemet as needed, which she did not tolerate due to nausea.  She also complains of new painful leg cramps that bother her a night and often prevent her from sleeping.  Since she was last here,  she has difficulty with walking long distances because her legs feel heavy and "stuck", she has some radiating buttocks pain. When at [employer], she has noticed that using the cart after shopping for some time can help.  Rest usually helps.  She continues to walk unassisted and stays active.  No interval falls.    Medications:  Current Outpatient Medications on File Prior to Visit  Medication Sig Dispense Refill  . cholecalciferol (VITAMIN D) 1000 UNITS tablet Take 1,000 Units by mouth daily.    . fexofenadine (ALLEGRA) 180 MG tablet Take 180 mg by mouth daily.    Marland Kitchen lisinopril-hydrochlorothiazide (PRINZIDE,ZESTORETIC) 20-25 MG per tablet Take 1 tablet by mouth every morning.    Marland Kitchen omeprazole (PRILOSEC OTC) 20 MG tablet Take 20 mg by mouth daily.    . rizatriptan (MAXALT) 10 MG tablet Take 10 mg by mouth as needed for migraine. May repeat in 2 hours if needed    . vitamin B-12 (CYANOCOBALAMIN) 500 MCG tablet Take 500 mcg by mouth daily.     No current facility-administered medications on file prior to visit.     Allergies:  Allergies  Allergen Reactions  . Codeine Nausea And Vomiting  . Demerol [Meperidine]     BP drops  . Hydrocodone Nausea And Vomiting  . Meperidine Hcl     Review of Systems:  CONSTITUTIONAL: No fevers, chills, night sweats, or weight loss.  EYES: No visual changes or eye pain ENT: No hearing changes.  No history of nose bleeds.   RESPIRATORY: No cough, wheezing and shortness of breath.   CARDIOVASCULAR: Negative for chest pain, and palpitations.   GI: Negative for abdominal discomfort, blood in stools or black stools.  No recent change in bowel habits.   GU:  No history of incontinence.   MUSCLOSKELETAL: No history of joint pain or swelling.  No myalgias.   SKIN: Negative for lesions, rash, and itching.   ENDOCRINE: Negative for cold or heat intolerance, polydipsia or goiter.   PSYCH:  No depression or anxiety symptoms.   NEURO: As Above.   Vital Signs:  BP  (!) 148/90   Pulse 77   Ht 5\' 4"  (1.626 m)   Wt 174 lb (78.9 kg)   SpO2 97%   BMI 29.87 kg/m    General Medical Exam:   General:  Well appearing, comfortable  Eyes/ENT: see cranial nerve examination.   Neck: No masses appreciated.  Full range of motion without tenderness.  No carotid bruits. Respiratory:  Clear to auscultation, good air entry bilaterally.   Cardiac:  Regular rate and rhythm, no murmur.   Ext:  No edema  Neurological Exam: MENTAL STATUS including orientation to time, place, person, recent and remote memory, attention span and concentration, language, and fund of knowledge is normal.  Speech is not dysarthric.  CRANIAL NERVES: No visual field defects. Pupils equal round and reactive to light.  Normal conjugate, extra-ocular eye movements in all directions of gaze.  No ptosis. Normal facial sensation.  Face is symmetric. Palate elevates symmetrically.  Tongue is midline.  MOTOR:  Motor strength is 5/5 in all extremities.   No pronator drift.  Tone is normal.    MSRs:  Reflexes are 2+/4 throughout, absent in the ankles.  SENSORY:  Intact to vibration, pin prick and temperature throughout.  COORDINATION/GAIT:  Normal finger-to- nose-finger.  Gait narrow based and stable.    Data: NCS/EMG of the legs 01/25/2017:This is a normal study of the lower extremities. In particular, there is no evidence of a generalized sensorimotor polyneuropathy or lumbosacral radiculopathy.  Labs 01/24/2017:  Vitamin B12 830, MMA 0.8, copper 530, folate 12.8  Lab Results  Component Value Date   FERRITIN 28 01/24/2017    IMPRESSION/PLAN: 1.  Restless leg syndrome. Clinically unchanged. She prefers to limit pharmocological therapy, so continues to try nonpharmacological methods including reducing caffeine consumption, stretching and exercising before bedtime.  I offered sinemet for breakthrough symptoms, but she did not tolerate this due to nausea.  She is not interested in dopamine agonist  therapy at this time.  2.  Neurogenic claudication with likely spinal stenosis.  Recommend physical therapy.  Start flexeril 2.5-5mg  at bedtime as needed.  If no improvement, proceed with MRI lumbar spine.   3.  Bilateral Morton's neuropathy causing 2nd toe numbness.  Clinically, stable.  Return to clinic in 4 months   Thank you for allowing me to participate in patient's care.  If I can answer any additional questions, I would be pleased to do so.    Sincerely,    Kamir Selover K. Posey Pronto, DO

## 2017-11-05 ENCOUNTER — Ambulatory Visit (INDEPENDENT_AMBULATORY_CARE_PROVIDER_SITE_OTHER): Payer: Medicare Other | Admitting: Neurology

## 2017-11-05 ENCOUNTER — Encounter: Payer: Self-pay | Admitting: Neurology

## 2017-11-05 VITALS — BP 148/90 | HR 77 | Ht 64.0 in | Wt 174.0 lb

## 2017-11-05 DIAGNOSIS — M48062 Spinal stenosis, lumbar region with neurogenic claudication: Secondary | ICD-10-CM | POA: Diagnosis not present

## 2017-11-05 DIAGNOSIS — G2581 Restless legs syndrome: Secondary | ICD-10-CM | POA: Diagnosis not present

## 2017-11-05 DIAGNOSIS — R252 Cramp and spasm: Secondary | ICD-10-CM | POA: Diagnosis not present

## 2017-11-05 MED ORDER — CYCLOBENZAPRINE HCL 5 MG PO TABS: 2.5000 mg | ORAL_TABLET | Freq: Every evening | ORAL | 5 refills | Status: AC | PRN

## 2017-11-05 NOTE — Patient Instructions (Addendum)
Start physical therapy for low back pain  Start flexeril 2.5-5mg  at bedtime as needed for cramps  If your pain continues to get worse, please call my office and we will order MRI lumbar spine

## 2017-11-07 DIAGNOSIS — M79605 Pain in left leg: Secondary | ICD-10-CM | POA: Diagnosis not present

## 2017-11-07 DIAGNOSIS — M545 Low back pain: Secondary | ICD-10-CM | POA: Diagnosis not present

## 2017-11-07 DIAGNOSIS — R2689 Other abnormalities of gait and mobility: Secondary | ICD-10-CM | POA: Diagnosis not present

## 2017-11-07 DIAGNOSIS — M79604 Pain in right leg: Secondary | ICD-10-CM | POA: Diagnosis not present

## 2017-11-14 DIAGNOSIS — M545 Low back pain: Secondary | ICD-10-CM | POA: Diagnosis not present

## 2017-11-14 DIAGNOSIS — M79605 Pain in left leg: Secondary | ICD-10-CM | POA: Diagnosis not present

## 2017-11-14 DIAGNOSIS — M79604 Pain in right leg: Secondary | ICD-10-CM | POA: Diagnosis not present

## 2017-11-14 DIAGNOSIS — R2689 Other abnormalities of gait and mobility: Secondary | ICD-10-CM | POA: Diagnosis not present

## 2017-11-20 DIAGNOSIS — R2689 Other abnormalities of gait and mobility: Secondary | ICD-10-CM | POA: Diagnosis not present

## 2017-11-20 DIAGNOSIS — M79604 Pain in right leg: Secondary | ICD-10-CM | POA: Diagnosis not present

## 2017-11-20 DIAGNOSIS — M79605 Pain in left leg: Secondary | ICD-10-CM | POA: Diagnosis not present

## 2017-11-20 DIAGNOSIS — M545 Low back pain: Secondary | ICD-10-CM | POA: Diagnosis not present

## 2017-11-21 DIAGNOSIS — R5382 Chronic fatigue, unspecified: Secondary | ICD-10-CM | POA: Diagnosis not present

## 2017-11-21 DIAGNOSIS — I10 Essential (primary) hypertension: Secondary | ICD-10-CM | POA: Diagnosis not present

## 2017-11-21 DIAGNOSIS — F3341 Major depressive disorder, recurrent, in partial remission: Secondary | ICD-10-CM | POA: Diagnosis not present

## 2017-11-21 DIAGNOSIS — Z0000 Encounter for general adult medical examination without abnormal findings: Secondary | ICD-10-CM | POA: Diagnosis not present

## 2017-11-21 DIAGNOSIS — E785 Hyperlipidemia, unspecified: Secondary | ICD-10-CM | POA: Diagnosis not present

## 2017-11-22 DIAGNOSIS — M79605 Pain in left leg: Secondary | ICD-10-CM | POA: Diagnosis not present

## 2017-11-22 DIAGNOSIS — M79604 Pain in right leg: Secondary | ICD-10-CM | POA: Diagnosis not present

## 2017-11-22 DIAGNOSIS — R2689 Other abnormalities of gait and mobility: Secondary | ICD-10-CM | POA: Diagnosis not present

## 2017-11-22 DIAGNOSIS — M545 Low back pain: Secondary | ICD-10-CM | POA: Diagnosis not present

## 2017-11-27 DIAGNOSIS — M79604 Pain in right leg: Secondary | ICD-10-CM | POA: Diagnosis not present

## 2017-11-27 DIAGNOSIS — M79605 Pain in left leg: Secondary | ICD-10-CM | POA: Diagnosis not present

## 2017-11-27 DIAGNOSIS — R2689 Other abnormalities of gait and mobility: Secondary | ICD-10-CM | POA: Diagnosis not present

## 2017-11-27 DIAGNOSIS — M545 Low back pain: Secondary | ICD-10-CM | POA: Diagnosis not present

## 2017-12-06 DIAGNOSIS — M79605 Pain in left leg: Secondary | ICD-10-CM | POA: Diagnosis not present

## 2017-12-06 DIAGNOSIS — M79604 Pain in right leg: Secondary | ICD-10-CM | POA: Diagnosis not present

## 2017-12-06 DIAGNOSIS — R2689 Other abnormalities of gait and mobility: Secondary | ICD-10-CM | POA: Diagnosis not present

## 2017-12-06 DIAGNOSIS — M545 Low back pain: Secondary | ICD-10-CM | POA: Diagnosis not present

## 2017-12-11 DIAGNOSIS — R2689 Other abnormalities of gait and mobility: Secondary | ICD-10-CM | POA: Diagnosis not present

## 2017-12-11 DIAGNOSIS — M79604 Pain in right leg: Secondary | ICD-10-CM | POA: Diagnosis not present

## 2017-12-11 DIAGNOSIS — M79605 Pain in left leg: Secondary | ICD-10-CM | POA: Diagnosis not present

## 2017-12-11 DIAGNOSIS — M545 Low back pain: Secondary | ICD-10-CM | POA: Diagnosis not present

## 2017-12-18 DIAGNOSIS — Z803 Family history of malignant neoplasm of breast: Secondary | ICD-10-CM | POA: Diagnosis not present

## 2017-12-18 DIAGNOSIS — Z1231 Encounter for screening mammogram for malignant neoplasm of breast: Secondary | ICD-10-CM | POA: Diagnosis not present

## 2018-03-11 ENCOUNTER — Ambulatory Visit: Payer: Medicare Other | Admitting: Neurology

## 2018-03-18 DIAGNOSIS — Z23 Encounter for immunization: Secondary | ICD-10-CM | POA: Diagnosis not present

## 2018-05-06 DIAGNOSIS — Z961 Presence of intraocular lens: Secondary | ICD-10-CM | POA: Diagnosis not present

## 2018-05-06 DIAGNOSIS — H21233 Degeneration of iris (pigmentary), bilateral: Secondary | ICD-10-CM | POA: Diagnosis not present

## 2018-11-25 DIAGNOSIS — N760 Acute vaginitis: Secondary | ICD-10-CM | POA: Diagnosis not present

## 2018-12-02 DIAGNOSIS — N898 Other specified noninflammatory disorders of vagina: Secondary | ICD-10-CM | POA: Diagnosis not present

## 2018-12-09 DIAGNOSIS — N952 Postmenopausal atrophic vaginitis: Secondary | ICD-10-CM | POA: Diagnosis not present

## 2018-12-09 DIAGNOSIS — N898 Other specified noninflammatory disorders of vagina: Secondary | ICD-10-CM | POA: Diagnosis not present

## 2018-12-18 DIAGNOSIS — I10 Essential (primary) hypertension: Secondary | ICD-10-CM | POA: Diagnosis not present

## 2018-12-18 DIAGNOSIS — E785 Hyperlipidemia, unspecified: Secondary | ICD-10-CM | POA: Diagnosis not present

## 2018-12-23 DIAGNOSIS — E785 Hyperlipidemia, unspecified: Secondary | ICD-10-CM | POA: Diagnosis not present

## 2018-12-23 DIAGNOSIS — F3341 Major depressive disorder, recurrent, in partial remission: Secondary | ICD-10-CM | POA: Diagnosis not present

## 2018-12-23 DIAGNOSIS — Z1211 Encounter for screening for malignant neoplasm of colon: Secondary | ICD-10-CM | POA: Diagnosis not present

## 2018-12-23 DIAGNOSIS — Z0000 Encounter for general adult medical examination without abnormal findings: Secondary | ICD-10-CM | POA: Diagnosis not present

## 2018-12-23 DIAGNOSIS — I10 Essential (primary) hypertension: Secondary | ICD-10-CM | POA: Diagnosis not present

## 2018-12-24 DIAGNOSIS — Z1231 Encounter for screening mammogram for malignant neoplasm of breast: Secondary | ICD-10-CM | POA: Diagnosis not present

## 2018-12-24 DIAGNOSIS — Z803 Family history of malignant neoplasm of breast: Secondary | ICD-10-CM | POA: Diagnosis not present

## 2019-02-21 DIAGNOSIS — Z23 Encounter for immunization: Secondary | ICD-10-CM | POA: Diagnosis not present

## 2019-07-25 DIAGNOSIS — L720 Epidermal cyst: Secondary | ICD-10-CM | POA: Diagnosis not present

## 2019-08-22 DIAGNOSIS — L821 Other seborrheic keratosis: Secondary | ICD-10-CM | POA: Diagnosis not present

## 2019-08-22 DIAGNOSIS — L538 Other specified erythematous conditions: Secondary | ICD-10-CM | POA: Diagnosis not present

## 2019-08-22 DIAGNOSIS — D485 Neoplasm of uncertain behavior of skin: Secondary | ICD-10-CM | POA: Diagnosis not present

## 2019-08-22 DIAGNOSIS — R208 Other disturbances of skin sensation: Secondary | ICD-10-CM | POA: Diagnosis not present
